# Patient Record
Sex: Female | Born: 1990 | Race: White | Hispanic: No | Marital: Single | Smoking: Current every day smoker
Health system: Southern US, Community
[De-identification: ages and names within clinical notes are randomized; demographics above are authoritative.]

## PROBLEM LIST (undated history)

## (undated) DIAGNOSIS — Z87898 Personal history of other specified conditions: Secondary | ICD-10-CM

## (undated) DIAGNOSIS — F4312 Post-traumatic stress disorder, chronic: Secondary | ICD-10-CM

## (undated) DIAGNOSIS — N898 Other specified noninflammatory disorders of vagina: Secondary | ICD-10-CM

## (undated) DIAGNOSIS — F1991 Other psychoactive substance use, unspecified, in remission: Secondary | ICD-10-CM

## (undated) DIAGNOSIS — R110 Nausea: Secondary | ICD-10-CM

## (undated) DIAGNOSIS — F319 Bipolar disorder, unspecified: Secondary | ICD-10-CM

## (undated) DIAGNOSIS — Z8619 Personal history of other infectious and parasitic diseases: Secondary | ICD-10-CM

## (undated) DIAGNOSIS — F32A Depression, unspecified: Secondary | ICD-10-CM

## (undated) DIAGNOSIS — G43909 Migraine, unspecified, not intractable, without status migrainosus: Secondary | ICD-10-CM

## (undated) DIAGNOSIS — F419 Anxiety disorder, unspecified: Secondary | ICD-10-CM

## (undated) HISTORY — DX: Depression, unspecified: F32.A

## (undated) HISTORY — DX: Personal history of other specified conditions: Z87.898

## (undated) HISTORY — DX: Personal history of other infectious and parasitic diseases: Z86.19

## (undated) HISTORY — DX: Other specified noninflammatory disorders of vagina: N89.8

## (undated) HISTORY — DX: Bipolar disorder, unspecified: F31.9

## (undated) HISTORY — DX: Nausea: R11.0

## (undated) HISTORY — DX: Anxiety disorder, unspecified: F41.9

## (undated) HISTORY — DX: Post-traumatic stress disorder, chronic: F43.12

## (undated) HISTORY — PX: BUNIONECTOMY: SHX129

## (undated) HISTORY — DX: Migraine, unspecified, not intractable, without status migrainosus: G43.909

## (undated) HISTORY — PX: APPENDECTOMY: SHX54

## (undated) HISTORY — DX: Other psychoactive substance use, unspecified, in remission: F19.91

---

## 2013-02-05 DIAGNOSIS — F319 Bipolar disorder, unspecified: Secondary | ICD-10-CM | POA: Diagnosis present

## 2016-08-01 DIAGNOSIS — B182 Chronic viral hepatitis C: Secondary | ICD-10-CM | POA: Diagnosis present

## 2017-03-20 DIAGNOSIS — F330 Major depressive disorder, recurrent, mild: Secondary | ICD-10-CM | POA: Insufficient documentation

## 2017-03-20 DIAGNOSIS — F411 Generalized anxiety disorder: Secondary | ICD-10-CM | POA: Insufficient documentation

## 2017-12-30 DIAGNOSIS — B1920 Unspecified viral hepatitis C without hepatic coma: Secondary | ICD-10-CM | POA: Insufficient documentation

## 2017-12-30 LAB — HM HIV SCREENING LAB: HM HIV Screening: NEGATIVE

## 2019-04-17 DIAGNOSIS — F411 Generalized anxiety disorder: Secondary | ICD-10-CM

## 2019-04-17 DIAGNOSIS — F330 Major depressive disorder, recurrent, mild: Secondary | ICD-10-CM

## 2019-04-20 ENCOUNTER — Other Ambulatory Visit: Payer: Self-pay

## 2019-04-20 ENCOUNTER — Ambulatory Visit (LOCAL_COMMUNITY_HEALTH_CENTER): Payer: Medicaid Other

## 2019-04-20 VITALS — BP 96/66 | Ht 62.0 in | Wt 115.0 lb

## 2019-04-20 DIAGNOSIS — Z30013 Encounter for initial prescription of injectable contraceptive: Secondary | ICD-10-CM | POA: Diagnosis not present

## 2019-04-20 DIAGNOSIS — Z3009 Encounter for other general counseling and advice on contraception: Secondary | ICD-10-CM | POA: Diagnosis not present

## 2019-04-20 MED ORDER — MEDROXYPROGESTERONE ACETATE 150 MG/ML IM SUSP
150.0000 mg | Freq: Once | INTRAMUSCULAR | Status: AC
  Administered 2019-04-20: 150 mg via INTRAMUSCULAR

## 2019-04-20 NOTE — Progress Notes (Signed)
Depo given per C. Hampton PA VO. Tolerated well Teryn Boerema, RN  

## 2019-07-06 ENCOUNTER — Ambulatory Visit (LOCAL_COMMUNITY_HEALTH_CENTER): Payer: Medicaid Other

## 2019-07-06 ENCOUNTER — Other Ambulatory Visit: Payer: Self-pay

## 2019-07-06 VITALS — BP 107/67 | Ht 61.0 in | Wt 119.5 lb

## 2019-07-06 DIAGNOSIS — Z30013 Encounter for initial prescription of injectable contraceptive: Secondary | ICD-10-CM

## 2019-07-06 DIAGNOSIS — Z3009 Encounter for other general counseling and advice on contraception: Secondary | ICD-10-CM | POA: Diagnosis not present

## 2019-07-06 MED ORDER — MEDROXYPROGESTERONE ACETATE 150 MG/ML IM SUSP
150.0000 mg | Freq: Once | INTRAMUSCULAR | Status: AC
  Administered 2019-07-06: 09:00:00 150 mg via INTRAMUSCULAR

## 2019-07-06 NOTE — Progress Notes (Signed)
Pt here for Depo. Pt reports no issues with Depo. Last Depo 04/20/2019, so pt is 11 weeks and 0 days post last Depo. Last PE 12/2017. Consulted with Antoine Primas, PA and per Antoine Primas, PA verbal order ok for pt to get Depo 150mg  IM and to return in 11-13 weeks for physical/ [place] RV. Pt received Depo 150mg  IM per Antoine Primas, PA verbal order. Pt tolerated well.Ronny Bacon, RN

## 2019-07-06 NOTE — Progress Notes (Signed)
Consulted by RN re:  Patient needs order for Depo. Verbal order given for patient to have Depo today but to schedule for RV/RP at next appointment.  Last RP 12/2017.

## 2020-02-03 ENCOUNTER — Encounter: Payer: Self-pay | Admitting: Advanced Practice Midwife

## 2020-02-03 ENCOUNTER — Other Ambulatory Visit: Payer: Self-pay

## 2020-02-03 ENCOUNTER — Ambulatory Visit (LOCAL_COMMUNITY_HEALTH_CENTER): Payer: Medicaid Other | Admitting: Advanced Practice Midwife

## 2020-02-03 VITALS — BP 106/70 | Ht 61.0 in | Wt 124.0 lb

## 2020-02-03 DIAGNOSIS — F1191 Opioid use, unspecified, in remission: Secondary | ICD-10-CM

## 2020-02-03 DIAGNOSIS — Z30013 Encounter for initial prescription of injectable contraceptive: Secondary | ICD-10-CM

## 2020-02-03 DIAGNOSIS — Z3009 Encounter for other general counseling and advice on contraception: Secondary | ICD-10-CM

## 2020-02-03 DIAGNOSIS — Z87898 Personal history of other specified conditions: Secondary | ICD-10-CM | POA: Insufficient documentation

## 2020-02-03 DIAGNOSIS — F17200 Nicotine dependence, unspecified, uncomplicated: Secondary | ICD-10-CM

## 2020-02-03 DIAGNOSIS — Z7289 Other problems related to lifestyle: Secondary | ICD-10-CM

## 2020-02-03 DIAGNOSIS — Z653 Problems related to other legal circumstances: Secondary | ICD-10-CM

## 2020-02-03 DIAGNOSIS — R630 Anorexia: Secondary | ICD-10-CM

## 2020-02-03 LAB — WET PREP FOR TRICH, YEAST, CLUE
Trichomonas Exam: NEGATIVE
Yeast Exam: NEGATIVE

## 2020-02-03 LAB — PREGNANCY, URINE: Preg Test, Ur: NEGATIVE

## 2020-02-03 MED ORDER — MULTI-VITAMIN/MINERALS PO TABS: 1.0000 | ORAL_TABLET | Freq: Every day | ORAL | 0 refills | Status: DC

## 2020-02-03 MED ORDER — MEDROXYPROGESTERONE ACETATE 150 MG/ML IM SUSP
150.0000 mg | INTRAMUSCULAR | Status: AC
  Administered 2020-02-03: 150 mg via INTRAMUSCULAR

## 2020-02-03 NOTE — Progress Notes (Signed)
Family Planning Visit- Initial Visit  Subjective:  Theresa Parker is a 29 y.o. SWF  [id number]   being seen today for an initial well woman visit and to discuss family planning options.  She is currently using nothing for pregnancy prevention. Patient reports she does not want a pregnancy in the next year.  Patient has the following medical conditions has Hepatitis C; Generalized anxiety disorder; Major depressive disorder, recurrent episode, mild (HCC); Smoker 1-2 ppd; History of crack cocaine use ages [age] with rehab x5; History of heroin use ages [age] with rehab x 5; Lost custody of daughter; Self-mutilation cutter age 55; choked self age 22; and Anorexia since childhood on their problem list.  Chief Complaint  Patient presents with  . Annual Exam    Patient reports wants DMPA but worried she hasn't had a menses since 10/24/2015.  Last DMPA 06/2019.  Last sex without condom and "sometime in may".  Not working.  Living with boyfriend and her 75 yo son who has special needs and working up for autism.  Smoker 1-2 ppd, vapes daily.  Last MJ 8 mo ago.  Crack age [age], heroin age [age] with rehab x5.  Last ETOH last night (1 wine cooler) 2x/mo.  Her daughter was adopted out and is 30 yo now living with PGM and PGF. States anorexia since a child.  Cutter age 60 and choked self age 61.  C/o low abdominal LLQ pain intermittently, no menses, no appetite,   Patient denies SI/HI   Body mass index is 23.43 kg/m. - Patient is eligible for diabetes screening based on BMI and age >37?  not applicable HA1C ordered? not applicable  Patient reports ? of partners in last year. Desires STI screening?  Yes  Has patient been screened once for HCV in the past?  Yes  No results found for: HCVAB  Does the patient have current of drug use, have a partner with drug use, and/or has been incarcerated since last result? No  If yes-- Screen for HCV through [facility]   Does the patient meet criteria for HBV testing?  No  Criteria:  -Household, sexual or needle sharing contact with HBV -History of drug use -HIV positive -Those with known Hep C   Health Maintenance Due  Topic Date Due  . COVID-19 Vaccine (1) Never done  . PAP SMEAR-Modifier  Never done    Review of Systems  All other systems reviewed and are negative.   The following portions of the patient's history were reviewed and updated as appropriate: allergies, current medications, past family history, past medical history, past social history, past surgical history and problem list. Problem list updated.   See flowsheet for other program required questions.  Objective:   Vitals:   02/03/20 0849  BP: 106/70  Weight: 124 lb (56.2 kg)  Height: 5\' 1"  (1.549 m)    Physical Exam Constitutional:      Appearance: Normal appearance. She is normal weight.  HENT:     Head: Normocephalic and atraumatic.     Mouth/Throat:     Mouth: Mucous membranes are moist.  Eyes:     Conjunctiva/sclera: Conjunctivae normal.  Cardiovascular:     Rate and Rhythm: Normal rate and regular rhythm.  Pulmonary:     Effort: Pulmonary effort is normal.     Breath sounds: Normal breath sounds.  Chest:     Breasts:        Right: Normal.        Left:  Normal.  Abdominal:     Palpations: Abdomen is soft.     Comments: Scaphoid, soft without tenderness  Genitourinary:    General: Normal vulva.     Exam position: Lithotomy position.     Vagina: Vaginal discharge (white creamy leukorrhea, ph<4.5) present.     Cervix: Normal.     Uterus: Normal.      Adnexa: Right adnexa normal and left adnexa normal.     Rectum: Normal.  Musculoskeletal:        General: Normal range of motion.     Cervical back: Normal range of motion and neck supple.  Skin:    General: Skin is warm and dry.  Neurological:     Mental Status: She is alert.  Psychiatric:        Mood and Affect: Mood normal.       Assessment and Plan:  Theresa Parker is a 29 y.o. female  presenting to the [facility] Department for an initial well woman exam/family planning visit  Contraception counseling: Reviewed all forms of birth control options in the tiered based approach. available including abstinence; over the counter/barrier methods; hormonal contraceptive medication including pill, patch, ring, injection,contraceptive implant, ECP; hormonal and nonhormonal IUDs; permanent sterilization options including vasectomy and the various tubal sterilization modalities. Risks, benefits, and typical effectiveness rates were reviewed.  Questions were answered.  Written information was also given to the patient to review.  Patient desires DMPA, this was prescribed for patient. She will follow up in  11-13 wks for surveillance.  She was told to call with any further questions, or with any concerns about this method of contraception.  Emphasized use of condoms 100% of the time for STI prevention.  Patient was offered ECP. ECP was not accepted by the patient. ECP counseling was not given - see RN documentation  1. Family planning Treat wet mount per standing orders Immunization nurse consult Please give Primary Care MD list to pt Please give pt Amanda Marvin's contact info--referral made - Pregnancy, urine - WET PREP FOR [place], YEAST, CLUE - IGP, Aptima HPV - Chlamydia/Gonorrhea  Lab  2. Smoker 1-2 ppd Counseled via 5 A's to stop smoking  3. Encounter for initial prescription of injectable contraceptive If PT neg today may have DMPA 150 mg IM q 11-13 wks x 1 year Please counsel on need for backup condoms/abstinance next 7 days  4. History of crack cocaine use ages [age] with rehab x5   5. History of heroin use ages [age] with rehab x 5  6. Lost custody of daughter   42. Self-mutilation cutter age 47; choked self age 31   8. Anorexia since childhood Referred to primary care MD and Milton Ferguson, LCSW     No follow-ups on file.  No future  appointments.  Herbie Saxon, CNM

## 2020-02-03 NOTE — Progress Notes (Signed)
UPT and wet mount negative. Bellamie Turney, RN  

## 2020-02-06 LAB — IGP, APTIMA HPV
HPV Aptima: POSITIVE — AB
PAP Smear Comment: 0

## 2020-02-09 ENCOUNTER — Encounter: Payer: Self-pay | Admitting: Advanced Practice Midwife

## 2020-02-09 DIAGNOSIS — R87619 Unspecified abnormal cytological findings in specimens from cervix uteri: Secondary | ICD-10-CM | POA: Insufficient documentation

## 2020-02-16 ENCOUNTER — Telehealth: Payer: Self-pay

## 2020-02-16 NOTE — Telephone Encounter (Signed)
Return call by patient.  Reviewed PAP results with patient. PAP Normal and HPV Positive.  Repeat PAP due in 1 year (01/2021) per Arnetha Courser, CNM.  Patient has requested to see the same provider at next physical.  She commented on how it was a "painless" PAP and she would like to see the same person.  I explained to her that when we call her next year to schedule her appointment to remind [place] to schedule with Arnetha Courser, CNM and/or same provider. Hart Carwin, RN

## 2020-02-16 NOTE — Telephone Encounter (Signed)
Left message for patient to return my call.

## 2020-02-18 DIAGNOSIS — Z419 Encounter for procedure for purposes other than remedying health state, unspecified: Secondary | ICD-10-CM | POA: Diagnosis not present

## 2020-03-20 DIAGNOSIS — Z419 Encounter for procedure for purposes other than remedying health state, unspecified: Secondary | ICD-10-CM | POA: Diagnosis not present

## 2020-04-20 DIAGNOSIS — Z419 Encounter for procedure for purposes other than remedying health state, unspecified: Secondary | ICD-10-CM | POA: Diagnosis not present

## 2020-05-18 ENCOUNTER — Telehealth: Payer: Self-pay

## 2020-05-18 NOTE — Telephone Encounter (Signed)
Phone call to pt. Pt confirms she does want to change her BC. Pt counseled that she was put in RN clinic rather than with a provider that has to change her BC orders. Pt states she needed to reschedule the 05/20/20 appt anyway due to some other conflicts. Pt unable to set another appt at this time, but would like to see Arnetha Courser, CNM when she does come in.

## 2020-05-20 DIAGNOSIS — Z419 Encounter for procedure for purposes other than remedying health state, unspecified: Secondary | ICD-10-CM | POA: Diagnosis not present

## 2020-06-20 DIAGNOSIS — Z419 Encounter for procedure for purposes other than remedying health state, unspecified: Secondary | ICD-10-CM | POA: Diagnosis not present

## 2020-07-20 DIAGNOSIS — Z419 Encounter for procedure for purposes other than remedying health state, unspecified: Secondary | ICD-10-CM | POA: Diagnosis not present

## 2020-08-20 DIAGNOSIS — Z419 Encounter for procedure for purposes other than remedying health state, unspecified: Secondary | ICD-10-CM | POA: Diagnosis not present

## 2020-09-20 DIAGNOSIS — Z419 Encounter for procedure for purposes other than remedying health state, unspecified: Secondary | ICD-10-CM | POA: Diagnosis not present

## 2020-10-18 DIAGNOSIS — Z419 Encounter for procedure for purposes other than remedying health state, unspecified: Secondary | ICD-10-CM | POA: Diagnosis not present

## 2020-11-18 DIAGNOSIS — Z419 Encounter for procedure for purposes other than remedying health state, unspecified: Secondary | ICD-10-CM | POA: Diagnosis not present

## 2020-12-18 DIAGNOSIS — Z419 Encounter for procedure for purposes other than remedying health state, unspecified: Secondary | ICD-10-CM | POA: Diagnosis not present

## 2021-01-18 DIAGNOSIS — Z419 Encounter for procedure for purposes other than remedying health state, unspecified: Secondary | ICD-10-CM | POA: Diagnosis not present

## 2021-01-26 ENCOUNTER — Telehealth: Payer: Self-pay | Admitting: Nurse Practitioner

## 2021-01-26 NOTE — Telephone Encounter (Signed)
Telephone call to patient regarding PAP follow up.  No answer, left message to call. Kayode Petion, RN  

## 2021-02-17 DIAGNOSIS — Z419 Encounter for procedure for purposes other than remedying health state, unspecified: Secondary | ICD-10-CM | POA: Diagnosis not present

## 2021-02-27 ENCOUNTER — Telehealth: Payer: Self-pay | Admitting: Nurse Practitioner

## 2021-02-27 NOTE — Telephone Encounter (Signed)
Telephone call to patients regarding PAP follow up.  Patient informed that her PAP was due.  Patient states she no longer lives in [facility] and wanted to see if she could schedule an appointment with the [facility] in her current county.  Patient to call back and schedule an appointment if needed. Glenna Fellows, RN

## 2021-03-11 DIAGNOSIS — J039 Acute tonsillitis, unspecified: Secondary | ICD-10-CM | POA: Diagnosis not present

## 2021-03-20 DIAGNOSIS — Z419 Encounter for procedure for purposes other than remedying health state, unspecified: Secondary | ICD-10-CM | POA: Diagnosis not present

## 2021-04-14 DIAGNOSIS — L292 Pruritus vulvae: Secondary | ICD-10-CM | POA: Diagnosis not present

## 2021-04-14 DIAGNOSIS — R300 Dysuria: Secondary | ICD-10-CM | POA: Diagnosis not present

## 2021-04-14 DIAGNOSIS — N390 Urinary tract infection, site not specified: Secondary | ICD-10-CM | POA: Diagnosis not present

## 2021-04-20 DIAGNOSIS — Z419 Encounter for procedure for purposes other than remedying health state, unspecified: Secondary | ICD-10-CM | POA: Diagnosis not present

## 2021-05-20 DIAGNOSIS — Z419 Encounter for procedure for purposes other than remedying health state, unspecified: Secondary | ICD-10-CM | POA: Diagnosis not present

## 2021-06-20 DIAGNOSIS — Z419 Encounter for procedure for purposes other than remedying health state, unspecified: Secondary | ICD-10-CM | POA: Diagnosis not present

## 2021-07-20 DIAGNOSIS — Z419 Encounter for procedure for purposes other than remedying health state, unspecified: Secondary | ICD-10-CM | POA: Diagnosis not present

## 2021-08-20 DIAGNOSIS — Z419 Encounter for procedure for purposes other than remedying health state, unspecified: Secondary | ICD-10-CM | POA: Diagnosis not present

## 2021-09-20 DIAGNOSIS — Z419 Encounter for procedure for purposes other than remedying health state, unspecified: Secondary | ICD-10-CM | POA: Diagnosis not present

## 2021-09-25 DIAGNOSIS — F4310 Post-traumatic stress disorder, unspecified: Secondary | ICD-10-CM | POA: Diagnosis not present

## 2021-10-10 DIAGNOSIS — F4310 Post-traumatic stress disorder, unspecified: Secondary | ICD-10-CM | POA: Diagnosis not present

## 2021-10-11 DIAGNOSIS — F4310 Post-traumatic stress disorder, unspecified: Secondary | ICD-10-CM | POA: Diagnosis not present

## 2021-10-14 DIAGNOSIS — F4310 Post-traumatic stress disorder, unspecified: Secondary | ICD-10-CM | POA: Diagnosis not present

## 2021-10-16 DIAGNOSIS — F4310 Post-traumatic stress disorder, unspecified: Secondary | ICD-10-CM | POA: Diagnosis not present

## 2021-10-17 DIAGNOSIS — F4310 Post-traumatic stress disorder, unspecified: Secondary | ICD-10-CM | POA: Diagnosis not present

## 2021-10-18 DIAGNOSIS — Z419 Encounter for procedure for purposes other than remedying health state, unspecified: Secondary | ICD-10-CM | POA: Diagnosis not present

## 2021-10-23 DIAGNOSIS — F4310 Post-traumatic stress disorder, unspecified: Secondary | ICD-10-CM | POA: Diagnosis not present

## 2021-10-24 DIAGNOSIS — F4310 Post-traumatic stress disorder, unspecified: Secondary | ICD-10-CM | POA: Diagnosis not present

## 2021-10-30 DIAGNOSIS — B1920 Unspecified viral hepatitis C without hepatic coma: Secondary | ICD-10-CM | POA: Diagnosis not present

## 2021-10-30 DIAGNOSIS — F4310 Post-traumatic stress disorder, unspecified: Secondary | ICD-10-CM | POA: Diagnosis not present

## 2021-10-30 DIAGNOSIS — E669 Obesity, unspecified: Secondary | ICD-10-CM | POA: Diagnosis not present

## 2021-10-31 DIAGNOSIS — F4310 Post-traumatic stress disorder, unspecified: Secondary | ICD-10-CM | POA: Diagnosis not present

## 2021-11-06 DIAGNOSIS — F4310 Post-traumatic stress disorder, unspecified: Secondary | ICD-10-CM | POA: Diagnosis not present

## 2021-11-07 DIAGNOSIS — F4310 Post-traumatic stress disorder, unspecified: Secondary | ICD-10-CM | POA: Diagnosis not present

## 2021-11-08 DIAGNOSIS — F4310 Post-traumatic stress disorder, unspecified: Secondary | ICD-10-CM | POA: Diagnosis not present

## 2021-11-13 DIAGNOSIS — F4310 Post-traumatic stress disorder, unspecified: Secondary | ICD-10-CM | POA: Diagnosis not present

## 2021-11-14 DIAGNOSIS — F4310 Post-traumatic stress disorder, unspecified: Secondary | ICD-10-CM | POA: Diagnosis not present

## 2021-11-16 DIAGNOSIS — F411 Generalized anxiety disorder: Secondary | ICD-10-CM | POA: Diagnosis not present

## 2021-11-18 DIAGNOSIS — Z419 Encounter for procedure for purposes other than remedying health state, unspecified: Secondary | ICD-10-CM | POA: Diagnosis not present

## 2021-11-21 DIAGNOSIS — F4310 Post-traumatic stress disorder, unspecified: Secondary | ICD-10-CM | POA: Diagnosis not present

## 2021-11-22 DIAGNOSIS — F4310 Post-traumatic stress disorder, unspecified: Secondary | ICD-10-CM | POA: Diagnosis not present

## 2021-11-28 DIAGNOSIS — F4310 Post-traumatic stress disorder, unspecified: Secondary | ICD-10-CM | POA: Diagnosis not present

## 2021-11-29 DIAGNOSIS — F4310 Post-traumatic stress disorder, unspecified: Secondary | ICD-10-CM | POA: Diagnosis not present

## 2021-12-06 DIAGNOSIS — F411 Generalized anxiety disorder: Secondary | ICD-10-CM | POA: Diagnosis not present

## 2021-12-06 DIAGNOSIS — F1011 Alcohol abuse, in remission: Secondary | ICD-10-CM | POA: Diagnosis not present

## 2021-12-06 DIAGNOSIS — F332 Major depressive disorder, recurrent severe without psychotic features: Secondary | ICD-10-CM | POA: Diagnosis not present

## 2021-12-06 DIAGNOSIS — J9801 Acute bronchospasm: Secondary | ICD-10-CM | POA: Diagnosis not present

## 2021-12-06 DIAGNOSIS — F4310 Post-traumatic stress disorder, unspecified: Secondary | ICD-10-CM | POA: Diagnosis not present

## 2021-12-07 DIAGNOSIS — F4310 Post-traumatic stress disorder, unspecified: Secondary | ICD-10-CM | POA: Diagnosis not present

## 2021-12-10 DIAGNOSIS — F4310 Post-traumatic stress disorder, unspecified: Secondary | ICD-10-CM | POA: Diagnosis not present

## 2021-12-18 DIAGNOSIS — Z419 Encounter for procedure for purposes other than remedying health state, unspecified: Secondary | ICD-10-CM | POA: Diagnosis not present

## 2021-12-21 DIAGNOSIS — F4310 Post-traumatic stress disorder, unspecified: Secondary | ICD-10-CM | POA: Diagnosis not present

## 2021-12-21 DIAGNOSIS — F411 Generalized anxiety disorder: Secondary | ICD-10-CM | POA: Diagnosis not present

## 2021-12-21 DIAGNOSIS — F332 Major depressive disorder, recurrent severe without psychotic features: Secondary | ICD-10-CM | POA: Diagnosis not present

## 2022-01-18 DIAGNOSIS — F4310 Post-traumatic stress disorder, unspecified: Secondary | ICD-10-CM | POA: Diagnosis not present

## 2022-01-18 DIAGNOSIS — Z419 Encounter for procedure for purposes other than remedying health state, unspecified: Secondary | ICD-10-CM | POA: Diagnosis not present

## 2022-01-19 ENCOUNTER — Other Ambulatory Visit: Payer: Self-pay | Admitting: Family

## 2022-02-06 ENCOUNTER — Emergency Department: Payer: Medicaid Other

## 2022-02-06 ENCOUNTER — Encounter: Payer: Self-pay | Admitting: Emergency Medicine

## 2022-02-06 ENCOUNTER — Emergency Department
Admission: EM | Admit: 2022-02-06 | Discharge: 2022-02-06 | Disposition: A | Discharge status: Home / Self Care | Payer: Medicaid Other | Attending: Emergency Medicine | Admitting: Emergency Medicine

## 2022-02-06 DIAGNOSIS — R202 Paresthesia of skin: Secondary | ICD-10-CM | POA: Diagnosis not present

## 2022-02-06 DIAGNOSIS — R200 Anesthesia of skin: Secondary | ICD-10-CM | POA: Insufficient documentation

## 2022-02-06 DIAGNOSIS — M25531 Pain in right wrist: Secondary | ICD-10-CM | POA: Diagnosis not present

## 2022-02-06 DIAGNOSIS — M25532 Pain in left wrist: Secondary | ICD-10-CM | POA: Insufficient documentation

## 2022-02-06 MED ORDER — PREDNISONE 10 MG (21) PO TBPK: ORAL_TABLET | ORAL | 0 refills | Status: DC

## 2022-02-06 NOTE — ED Triage Notes (Signed)
Pt endorses worsening wrist and hand pain, especially with writing more frequently.

## 2022-02-06 NOTE — Discharge Instructions (Signed)
Follow-up with your family doctor or orthopedics.  Please call for an appointment.  Wear the wrist brace especially at night to prevent you from bending your wrist.  This will decrease inflammation.  Can try a steroid.  After you are done with steroid return to either Advil or Aleve.

## 2022-02-06 NOTE — ED Provider Notes (Signed)
   [facility] Provider Note    None    (approximate)   History   Wrist Pain   HPI  Theresa Parker is a 31 y.o. female complains of bilateral wrist pain.  Some numbness and tingling typical carpal tunnel.  Patient states worse on the right side she is right-handed.  No numbness.  Just pain that shoots up to the thumb and into the arm      Physical Exam   Triage Vital Signs: ED Triage Vitals [02/06/22 1804]  Enc Vitals Group     BP 110/71     Pulse Rate 75     Resp 17     Temp 98.3 F (36.8 C)     Temp Source Oral     SpO2 98 %     Weight      Height      Head Circumference      Peak Flow      Pain Score      Pain Loc      Pain Edu?      Excl. in GC?     Most recent vital signs: Vitals:   02/06/22 1804  BP: 110/71  Pulse: 75  Resp: 17  Temp: 98.3 F (36.8 C)  SpO2: 98%     General: Awake, no distress.   CV:  Good peripheral perfusion. regular rate and  rhythm Resp:  Normal effort. Lungs  Abd:  No distention.  Other:  Full range of motion, right wrist tender at the carpal bones, full range of motion of fingers, neurovascular is intact   ED Results / Procedures / Treatments   Labs (all labs ordered are listed, but only abnormal results are displayed) Labs Reviewed - No data to display   EKG     RADIOLOGY X-ray of the right wrist    PROCEDURES:   Procedures   MEDICATIONS ORDERED IN ED: Medications - No data to display   IMPRESSION / MDM / ASSESSMENT AND PLAN / ED COURSE  I reviewed the triage vital signs and the nursing notes.                              Differential diagnosis includes, but is not limited to, carpal tunnel, sprain, tendinitis  Patient's presentation is most consistent with acute complicated illness / injury requiring diagnostic workup.  Did explain the findings to the patient.  She is to follow-up with orthopedics.  She is placed in a wrist brace.  Sterapred to decrease inflammation.   Discharged stable condition.      FINAL CLINICAL IMPRESSION(S) / ED DIAGNOSES   Final diagnoses:  Right wrist pain     Rx / DC Orders   ED Discharge Orders          Ordered    predniSONE (STERAPRED UNI-PAK 21 TAB) 10 MG (21) TBPK tablet        02/06/22 1806             Note:  This document was prepared using Dragon voice recognition software and may include unintentional dictation errors.    Faythe Ghee, PA-C 02/06/22 1840    Minna Antis, MD 02/06/22 [phone]

## 2022-02-15 DIAGNOSIS — F4310 Post-traumatic stress disorder, unspecified: Secondary | ICD-10-CM | POA: Diagnosis not present

## 2022-02-17 DIAGNOSIS — Z419 Encounter for procedure for purposes other than remedying health state, unspecified: Secondary | ICD-10-CM | POA: Diagnosis not present

## 2022-02-21 DIAGNOSIS — F332 Major depressive disorder, recurrent severe without psychotic features: Secondary | ICD-10-CM | POA: Diagnosis not present

## 2022-02-21 DIAGNOSIS — F411 Generalized anxiety disorder: Secondary | ICD-10-CM | POA: Diagnosis not present

## 2022-02-21 DIAGNOSIS — F1011 Alcohol abuse, in remission: Secondary | ICD-10-CM | POA: Diagnosis not present

## 2022-02-21 DIAGNOSIS — F4310 Post-traumatic stress disorder, unspecified: Secondary | ICD-10-CM | POA: Diagnosis not present

## 2022-03-04 ENCOUNTER — Other Ambulatory Visit: Payer: Self-pay

## 2022-03-04 ENCOUNTER — Emergency Department
Admission: EM | Admit: 2022-03-04 | Discharge: 2022-03-04 | Disposition: A | Discharge status: Home / Self Care | Payer: Medicaid Other | Attending: Emergency Medicine | Admitting: Emergency Medicine

## 2022-03-04 DIAGNOSIS — S20369A Insect bite (nonvenomous) of unspecified front wall of thorax, initial encounter: Secondary | ICD-10-CM | POA: Insufficient documentation

## 2022-03-04 DIAGNOSIS — S80862A Insect bite (nonvenomous), left lower leg, initial encounter: Secondary | ICD-10-CM | POA: Insufficient documentation

## 2022-03-04 DIAGNOSIS — S80861A Insect bite (nonvenomous), right lower leg, initial encounter: Secondary | ICD-10-CM | POA: Insufficient documentation

## 2022-03-04 DIAGNOSIS — W57XXXA Bitten or stung by nonvenomous insect and other nonvenomous arthropods, initial encounter: Secondary | ICD-10-CM | POA: Insufficient documentation

## 2022-03-04 LAB — POC URINE PREG, ED: Preg Test, Ur: NEGATIVE

## 2022-03-04 MED ORDER — CEPHALEXIN 500 MG PO CAPS: 500.0000 mg | ORAL_CAPSULE | Freq: Three times a day (TID) | ORAL | 0 refills | Status: DC

## 2022-03-04 NOTE — ED Triage Notes (Signed)
Pt comes with c/o rash over body. Pt states bug bites or something. Pt states back of neck, leg and chest area.

## 2022-03-04 NOTE — ED Provider Notes (Signed)
   [facility] Provider Note    Event Date/Time   First MD Initiated Contact with Patient 03/04/22 1436     (approximate)   History   Rash   HPI  Theresa Parker is a 31 y.o. female presents emergency department with complaints of infected bug bites.  Has had 3 areas that are painful and sore.  Has been scratching at them.  Used over-the-counter medications without any relief.  No fever or chills no drainage from the site      Physical Exam   Triage Vital Signs: ED Triage Vitals  Enc Vitals Group     BP 03/04/22 1435 114/69     Pulse Rate 03/04/22 1435 100     Resp 03/04/22 1435 18     Temp 03/04/22 1435 98.4 F (36.9 C)     Temp Source 03/04/22 1435 Oral     SpO2 03/04/22 1435 98 %     Weight --      Height --      Head Circumference --      Peak Flow --      Pain Score 03/04/22 1409 0     Pain Loc --      Pain Edu? --      Excl. in GC? --     Most recent vital signs: Vitals:   03/04/22 1435  BP: 114/69  Pulse: 100  Resp: 18  Temp: 98.4 F (36.9 C)  SpO2: 98%     General: Awake, no distress.   CV:  Good peripheral perfusion. regular rate and  rhythm Resp:  Normal effort.  Abd:  No distention.   Other:  Skin several infected bug bites noted on the legs and 1 on her chest.   ED Results / Procedures / Treatments   Labs (all labs ordered are listed, but only abnormal results are displayed) Labs Reviewed  POC URINE PREG, ED     EKG     RADIOLOGY     PROCEDURES:   Procedures   MEDICATIONS ORDERED IN ED: Medications - No data to display   IMPRESSION / MDM / ASSESSMENT AND PLAN / ED COURSE  I reviewed the triage vital signs and the nursing notes.                              Differential diagnosis includes, but is not limited to, infected bug bite, cellulitis, impetigo, MRSA  Patient's presentation is most consistent with acute, uncomplicated illness.   Areas appear to be bug bites that have been  scratched or infected.  Patient is afebrile here in the ED.  We will give her a prescription for Keflex 500 3 times daily for 7 days.  She is apply over-the-counter hydrocortisone cream.  Return emergency department worsening      FINAL CLINICAL IMPRESSION(S) / ED DIAGNOSES   Final diagnoses:  Bug bite, initial encounter     Rx / DC Orders   ED Discharge Orders          Ordered    cephALEXin (KEFLEX) 500 MG capsule  3 times daily        03/04/22 1437             Note:  This document was prepared using Dragon voice recognition software and may include unintentional dictation errors.    Faythe Ghee, PA-C 03/04/22 1439    Merwyn Katos, MD 03/05/22 [phone]

## 2022-03-04 NOTE — Discharge Instructions (Signed)
Follow-up with your regular doctor if not improving in 3 days.  Return if worsening.  Take medication as prescribed.  Apply over-the-counter hydrocortisone cream for pain as needed

## 2022-03-13 ENCOUNTER — Ambulatory Visit (INDEPENDENT_AMBULATORY_CARE_PROVIDER_SITE_OTHER): Payer: Medicaid Other | Admitting: Nurse Practitioner

## 2022-03-13 ENCOUNTER — Encounter: Payer: Self-pay | Admitting: Nurse Practitioner

## 2022-03-13 VITALS — BP 109/69 | HR 73 | Temp 98.4°F | Ht 63.0 in | Wt 124.3 lb

## 2022-03-13 DIAGNOSIS — Z7689 Persons encountering health services in other specified circumstances: Secondary | ICD-10-CM | POA: Diagnosis not present

## 2022-03-13 DIAGNOSIS — R8281 Pyuria: Secondary | ICD-10-CM

## 2022-03-13 DIAGNOSIS — G8929 Other chronic pain: Secondary | ICD-10-CM | POA: Diagnosis not present

## 2022-03-13 DIAGNOSIS — L729 Follicular cyst of the skin and subcutaneous tissue, unspecified: Secondary | ICD-10-CM

## 2022-03-13 DIAGNOSIS — F330 Major depressive disorder, recurrent, mild: Secondary | ICD-10-CM | POA: Diagnosis not present

## 2022-03-13 DIAGNOSIS — B977 Papillomavirus as the cause of diseases classified elsewhere: Secondary | ICD-10-CM

## 2022-03-13 DIAGNOSIS — F411 Generalized anxiety disorder: Secondary | ICD-10-CM | POA: Diagnosis not present

## 2022-03-13 DIAGNOSIS — M546 Pain in thoracic spine: Secondary | ICD-10-CM | POA: Diagnosis not present

## 2022-03-13 DIAGNOSIS — B1920 Unspecified viral hepatitis C without hepatic coma: Secondary | ICD-10-CM | POA: Diagnosis not present

## 2022-03-13 DIAGNOSIS — R8290 Unspecified abnormal findings in urine: Secondary | ICD-10-CM | POA: Diagnosis not present

## 2022-03-13 LAB — URINALYSIS, ROUTINE W REFLEX MICROSCOPIC
Bilirubin, UA: NEGATIVE
Glucose, UA: NEGATIVE
Leukocytes,UA: NEGATIVE
Nitrite, UA: NEGATIVE
RBC, UA: NEGATIVE
Specific Gravity, UA: >1.03 — ABNORMAL HIGH (ref 1.005–1.030)
Urobilinogen, Ur: 0.2 mg/dL (ref 0.2–1.0)
pH, UA: 5.5 (ref 5.0–7.5)

## 2022-03-13 LAB — MICROSCOPIC EXAMINATION
RBC, Urine: NONE SEEN /hpf (ref 0–2)
WBC, UA: NONE SEEN /hpf (ref 0–5)

## 2022-03-13 LAB — WET PREP FOR TRICH, YEAST, CLUE
Clue Cell Exam: NEGATIVE
Trichomonas Exam: NEGATIVE
Yeast Exam: NEGATIVE

## 2022-03-13 NOTE — Progress Notes (Signed)
BP 109/69   Pulse 73   Temp 98.4 F (36.9 C) (Oral)   Ht 5\' 3"  (1.6 m)   Wt 124 lb 4.8 oz (56.4 kg)   LMP  (LMP Unknown)   SpO2 99%   BMI 22.02 kg/m    Subjective:    Patient ID: , female    DOB: 1990-10-27, 31 y.o.   MRN: [id number]  HPI: Theresa Parker is a 31 y.o. female  Chief Complaint  Patient presents with   Establish Care    Patient would like multiple referrals like chiropractor, dermatologist and hand specialist per patient. Patient states it has been a long time   Looking for PTSD psychiatrist - she says she is actively seeing one but is looking for someone who specializes in PTSD/trauma.    Patient presents to clinic to establish care with new PCP.  Introduced to 34 role and practice setting.  All questions answered.  Discussed provider/patient relationship and expectations.  Patient reports a history of high cholesterol, Depression, anxiety (seeing [facility]).  Had an appendectomy and bunionectomy.    Patient denies a history of: Hypertension, Diabetes, Thyroid problems, Neurological problems, and Abdominal problems.   Patient states she has a history drug use, PTSD, and trauma.  She has not received treatment for Hep C.  She is aware of the HPV but has not seen GYN. Has ongoing back pain and would like a referral to a chiropractor.  Patient also states she has Cysts all over her body and would like to see Dermatology.  Active Ambulatory Problems    Diagnosis Date Noted   Hepatitis C 12/30/2017   Generalized anxiety disorder 03/20/2017   Major depressive disorder, recurrent episode, mild (HCC) 03/20/2017   Smoker 1-2 ppd 02/03/2020   History of crack cocaine use ages [age] with rehab x5 02/03/2020   History of heroin use ages [age] with rehab x 5 02/03/2020   Lost custody of daughter 02/03/2020   Self-mutilation cutter age 40; choked self age 4 02/03/2020   Anorexia since childhood 02/03/2020   Abnormal Pap smear of cervix  02/03/20 02/09/2020   Resolved Ambulatory Problems    Diagnosis Date Noted   No Resolved Ambulatory Problems   Past Medical History:  Diagnosis Date   History of dizziness    History of hepatitis C    History of intravenous drug use in remission    Migraine    Nausea    Vaginal discharge    Past Surgical History:  Procedure Laterality Date   APPENDECTOMY     BUNIONECTOMY     Family History  Problem Relation Age of Onset   Endometriosis Mother    Depression Father    Cervical cancer Sister    Diabetes Paternal Grandfather    Heart disease Paternal Grandfather    Hypertension Paternal Grandfather    Depression Paternal Grandfather    Leukemia Paternal Grandfather      Review of Systems  Skin:        Cyst  Psychiatric/Behavioral:  Positive for dysphoric mood. The patient is nervous/anxious.     Per HPI unless specifically indicated above     Objective:    BP 109/69   Pulse 73   Temp 98.4 F (36.9 C) (Oral)   Ht 5\' 3"  (1.6 m)   Wt 124 lb 4.8 oz (56.4 kg)   LMP  (LMP Unknown)   SpO2 99%   BMI 22.02 kg/m   Wt Readings from Last 3 Encounters:  03/13/22 124 lb 4.8 oz (56.4 kg)  02/03/20 124 lb (56.2 kg)  07/06/19 119 lb 8 oz (54.2 kg)    Physical Exam Vitals and nursing note reviewed.  Constitutional:      General: She is not in acute distress.    Appearance: Normal appearance. She is normal weight. She is not ill-appearing, toxic-appearing or diaphoretic.  HENT:     Head: Normocephalic.     Right Ear: External ear normal.     Left Ear: External ear normal.     Nose: Nose normal.     Mouth/Throat:     Mouth: Mucous membranes are moist.     Pharynx: Oropharynx is clear.  Eyes:     General:        Right eye: No discharge.        Left eye: No discharge.     Extraocular Movements: Extraocular movements intact.     Conjunctiva/sclera: Conjunctivae normal.     Pupils: Pupils are equal, round, and reactive to light.  Cardiovascular:     Rate and Rhythm:  Normal rate and regular rhythm.     Heart sounds: No murmur heard. Pulmonary:     Effort: Pulmonary effort is normal. No respiratory distress.     Breath sounds: Normal breath sounds. No wheezing or rales.  Musculoskeletal:     Cervical back: Normal range of motion and neck supple.  Skin:    General: Skin is warm and dry.     Capillary Refill: Capillary refill takes less than 2 seconds.  Neurological:     General: No focal deficit present.     Mental Status: She is alert and oriented to person, place, and time. Mental status is at baseline.  Psychiatric:        Mood and Affect: Mood normal.        Behavior: Behavior normal.        Thought Content: Thought content normal.        Judgment: Judgment normal.     Results for orders placed or performed during the hospital encounter of 03/04/22  POC urine preg, ED  Result Value Ref Range   Preg Test, Ur NEGATIVE NEGATIVE      Assessment & Plan:   Problem List Items Addressed This Visit       Digestive   Hepatitis C    Chronic. Has not received treatment.  Referral placed for patient to see GI for evaluation and treatment.       Relevant Orders   Ambulatory referral to Gastroenterology     Other   Generalized anxiety disorder    Chronic. Not well controlled.  Was on Abilify but did not like the way it made her feel.  On Effexor, Trazodone and Buspar. Continue to see psychiatry. Referral placed for trauma therapist.  Follow up in 2 months for reevaluation.       Relevant Medications   busPIRone (BUSPAR) 7.5 MG tablet   hydrOXYzine (ATARAX) 25 MG tablet   venlafaxine XR (EFFEXOR-XR) 75 MG 24 hr capsule   mirtazapine (REMERON) 7.5 MG tablet   traZODone (DESYREL) 50 MG tablet   Other Relevant Orders   Ambulatory referral to Psychology   Major depressive disorder, recurrent episode, mild (HCC) - Primary    Chronic. Not well controlled.  Was on Abilify but did not like the way it made her feel.  On Effexor, Trazodone and  Buspar. Continue to see psychiatry. Referral placed for trauma therapist.  Follow up in 2  months for reevaluation.       Relevant Medications   busPIRone (BUSPAR) 7.5 MG tablet   hydrOXYzine (ATARAX) 25 MG tablet   venlafaxine XR (EFFEXOR-XR) 75 MG 24 hr capsule   mirtazapine (REMERON) 7.5 MG tablet   traZODone (DESYREL) 50 MG tablet   Other Relevant Orders   Ambulatory referral to Psychology   Other Visit Diagnoses     HPV (human papilloma virus) infection       Discussed abnormal results from 2021. Recommend she see GYN for further evaluation and treatment.   Chronic bilateral thoracic back pain       Referral placed for patient to see Chiropractor as requested by patient.   Relevant Medications   venlafaxine XR (EFFEXOR-XR) 75 MG 24 hr capsule   mirtazapine (REMERON) 7.5 MG tablet   traZODone (DESYREL) 50 MG tablet   Other Relevant Orders   Ambulatory referral to Chiropractic   Cyst of skin       Referral placed for patient to see Dermatology.   Relevant Orders   Ambulatory referral to Dermatology   Pyuria       Denies vaginal discharge. Will check Wet Prep and UA. Will treat based on results.    Relevant Orders   Urinalysis, Routine w reflex microscopic   WET PREP FOR TRICH, YEAST, CLUE   Encounter to establish care            Follow up plan: Return in about 2 months (around 05/14/2022) for Physical and Fasting labs.

## 2022-03-13 NOTE — Progress Notes (Signed)
Please let patient know that she is negative for trichomonas and BV. It looks like she needs to drink more water and increase her protein intake.

## 2022-03-13 NOTE — Assessment & Plan Note (Signed)
Chronic. Has not received treatment.  Referral placed for patient to see GI for evaluation and treatment.

## 2022-03-13 NOTE — Assessment & Plan Note (Signed)
Chronic. Not well controlled.  Was on Abilify but did not like the way it made her feel.  On Effexor, Trazodone and Buspar. Continue to see psychiatry. Referral placed for trauma therapist.  Follow up in 2 months for reevaluation.

## 2022-03-13 NOTE — Assessment & Plan Note (Signed)
Chronic. Not well controlled.  Was on Abilify but did not like the way it made her feel.  On Effexor, Trazodone and Buspar. Continue to see psychiatry. Referral placed for trauma therapist.  Follow up in 2 months for reevaluation.  

## 2022-03-13 NOTE — Addendum Note (Signed)
Addended by: Larae Grooms on: 03/13/2022 02:17 PM   Modules accepted: Orders

## 2022-03-15 LAB — URINE CULTURE

## 2022-03-15 NOTE — Progress Notes (Signed)
Please let patient know that her urine did not grow any bacteria. She can stop the antibiotics.

## 2022-03-16 ENCOUNTER — Ambulatory Visit: Payer: Medicaid Other | Admitting: Nurse Practitioner

## 2022-03-20 DIAGNOSIS — Z419 Encounter for procedure for purposes other than remedying health state, unspecified: Secondary | ICD-10-CM | POA: Diagnosis not present

## 2022-04-05 ENCOUNTER — Encounter: Payer: Self-pay | Admitting: Psychology

## 2022-04-16 ENCOUNTER — Telehealth: Payer: Self-pay

## 2022-04-16 NOTE — Telephone Encounter (Signed)
Copied from CRM [id number]. Topic: Referral - Question >> Apr 16, 2022 10:42 AM Theresa Parker wrote: Reason for CRM: Pt said when she was in July she ask Theresa Parker if she would send a referral to [facility] but they told her they never received one.  She said with medicaid they have to have a doctors referral.  [phone]  [phone]

## 2022-04-20 DIAGNOSIS — Z419 Encounter for procedure for purposes other than remedying health state, unspecified: Secondary | ICD-10-CM | POA: Diagnosis not present

## 2022-04-25 NOTE — Telephone Encounter (Signed)
Patient has been notified

## 2022-05-01 DIAGNOSIS — F411 Generalized anxiety disorder: Secondary | ICD-10-CM | POA: Diagnosis not present

## 2022-05-01 DIAGNOSIS — F4310 Post-traumatic stress disorder, unspecified: Secondary | ICD-10-CM | POA: Insufficient documentation

## 2022-05-14 NOTE — Progress Notes (Unsigned)
There were no vitals taken for this visit.   Subjective:    Patient ID: Theresa Parker, female    DOB: 11/11/1990, 31 y.o.   MRN: [id number]  HPI: Theresa Parker is a 31 y.o. female presenting on 05/15/2022 for comprehensive medical examination. Current medical complaints include:{Blank single:19197::"none","***"}  She currently lives with: Menopausal Symptoms: {Blank single:19197::"yes","no"}  ANXIETY  Depression Screen done today and results listed below:     03/13/2022    1:12 PM 02/03/2020    9:17 AM  Depression screen PHQ 2/9  Decreased Interest 1 0  Down, Depressed, Hopeless 0 0  PHQ - 2 Score 1 0  Altered sleeping 0   Tired, decreased energy 1   Change in appetite 1   Feeling bad or failure about yourself  0   Trouble concentrating 0   Moving slowly or fidgety/restless 0   Suicidal thoughts 0   PHQ-9 Score 3   Difficult doing work/chores Not difficult at all     The patient {has/does not have:19849} a history of falls. I {did/did not:19850} complete a risk assessment for falls. A plan of care for falls {was/was not:19852} documented.   Past Medical History:  Past Medical History:  Diagnosis Date   History of dizziness    History of hepatitis C    History of intravenous drug use in remission    Migraine    Nausea    Vaginal discharge     Surgical History:  Past Surgical History:  Procedure Laterality Date   APPENDECTOMY     BUNIONECTOMY      Medications:  Current Outpatient Medications on File Prior to Visit  Medication Sig   busPIRone (BUSPAR) 7.5 MG tablet Take 7.5 mg by mouth 2 (two) times daily.   mirtazapine (REMERON) 7.5 MG tablet Take by mouth.   traZODone (DESYREL) 50 MG tablet Take 50-100 mg by mouth at bedtime as needed.   venlafaxine XR (EFFEXOR-XR) 75 MG 24 hr capsule Take 225 mg by mouth daily.   No current facility-administered medications on file prior to visit.    Allergies:  Allergies  Allergen Reactions   Latex Other (See  Comments)    Latex condoms cause yeast infections / vaginal irritation    Social History:  Social History   Socioeconomic History   Marital status: Single    Spouse name: Not on file   Number of children: 2   Years of education: 14   Highest education level: High school graduate  Occupational History   Not on file  Tobacco Use   Smoking status: Every Day    Packs/day: 1.50    Years: 14.00    Total pack years: 21.00    Types: Cigarettes, E-cigarettes   Smokeless tobacco: Never  Vaping Use   Vaping Use: Every day   Substances: Nicotine  Substance and Sexual Activity   Alcohol use: Yes    Comment: Last ETOH use 02/02/2020 pm   Drug use: Yes    Types: "Crack" cocaine, Heroin, Marijuana    Comment: Last heroin and crack cocaine use 5 years ago. Last marijuana use 8 months ago.   Sexual activity: Yes    Birth control/protection: None  Other Topics Concern   Not on file  Social History Narrative   Not on file   Social Determinants of Health   Financial Resource Strain: Not on file  Food Insecurity: Not on file  Transportation Needs: Not on file  Physical Activity: Not on file  Stress: Not  on file  Social Connections: Not on file  Intimate Partner Violence: Not At Risk (02/03/2020)   Humiliation, Afraid, Rape, and Kick questionnaire    Fear of Current or Ex-Partner: No    Emotionally Abused: No    Physically Abused: No    Sexually Abused: No   Social History   Tobacco Use  Smoking Status Every Day   Packs/day: 1.50   Years: 14.00   Total pack years: 21.00   Types: Cigarettes, E-cigarettes  Smokeless Tobacco Never   Social History   Substance and Sexual Activity  Alcohol Use Yes   Comment: Last ETOH use 02/02/2020 pm    Family History:  Family History  Problem Relation Age of Onset   Endometriosis Mother    Depression Father    Cervical cancer Sister    Diabetes Paternal Grandfather    Heart disease Paternal Grandfather    Hypertension Paternal  Grandfather    Depression Paternal Grandfather    Leukemia Paternal Grandfather     Past medical history, surgical history, medications, allergies, family history and social history reviewed with patient today and changes made to appropriate areas of the chart.   ROS All other ROS negative except what is listed above and in the HPI.      Objective:    There were no vitals taken for this visit.  Wt Readings from Last 3 Encounters:  03/13/22 124 lb 4.8 oz (56.4 kg)  02/03/20 124 lb (56.2 kg)  07/06/19 119 lb 8 oz (54.2 kg)    Physical Exam  Results for orders placed or performed in visit on 03/13/22  Microscopic Examination  Result Value Ref Range   WBC, UA None seen 0 - 5 /hpf   RBC, Urine None seen 0 - 2 /hpf   Epithelial Cells (non renal) 0-10 0 - 10 /hpf   Crystals Present (A) N/A   Crystal Type Calcium Oxalate N/A   Mucus, UA Present (A) Not Estab.   Bacteria, UA Moderate (A) None seen/Few  WET PREP FOR TRICH, YEAST, CLUE   Urine  Result Value Ref Range   Trichomonas Exam Negative Negative   Yeast Exam Negative Negative   Clue Cell Exam Negative Negative  Urine Culture   Specimen: Urine   UR  Result Value Ref Range   Urine Culture, Routine Final report    Organism ID, Bacteria Comment   Urinalysis, Routine w reflex microscopic  Result Value Ref Range   Specific Gravity, UA >1.030 (H) 1.005 - 1.030   pH, UA 5.5 5.0 - 7.5   Color, UA Amber (A) Yellow   Appearance Ur Cloudy (A) Clear   Leukocytes,UA Negative Negative   Protein,UA 1+ (A) Negative/Trace   Glucose, UA Negative Negative   Ketones, UA Trace (A) Negative   RBC, UA Negative Negative   Bilirubin, UA Negative Negative   Urobilinogen, Ur 0.2 0.2 - 1.0 mg/dL   Nitrite, UA Negative Negative   Microscopic Examination See below:       Assessment & Plan:   Problem List Items Addressed This Visit       Other   Generalized anxiety disorder - Primary   Major depressive disorder, recurrent episode,  mild (HCC)     Follow up plan: No follow-ups on file.   LABORATORY TESTING:  - Pap smear: {Blank single:19197::"pap done","not applicable","up to date","done elsewhere"}  IMMUNIZATIONS:   - Tdap: Tetanus vaccination status reviewed: {tetanus status:315746}. - Influenza: {Blank single:19197::"Up to date","Administered today","Postponed to flu season","Refused","Given elsewhere"} -  Pneumovax: {Blank single:19197::"Up to date","Administered today","Not applicable","Refused","Given elsewhere"} - Prevnar: {Blank single:19197::"Up to date","Administered today","Not applicable","Refused","Given elsewhere"} - COVID: {Blank single:19197::"Up to date","Administered today","Not applicable","Refused","Given elsewhere"} - HPV: {Blank single:19197::"Up to date","Administered today","Not applicable","Refused","Given elsewhere"} - Shingrix vaccine: {Blank single:19197::"Up to date","Administered today","Not applicable","Refused","Given elsewhere"}  SCREENING: -Mammogram: {Blank single:19197::"Up to date","Ordered today","Not applicable","Refused","Done elsewhere"}  - Colonoscopy: {Blank single:19197::"Up to date","Ordered today","Not applicable","Refused","Done elsewhere"}  - Bone Density: {Blank single:19197::"Up to date","Ordered today","Not applicable","Refused","Done elsewhere"}  -Hearing Test: {Blank single:19197::"Up to date","Ordered today","Not applicable","Refused","Done elsewhere"}  -Spirometry: {Blank single:19197::"Up to date","Ordered today","Not applicable","Refused","Done elsewhere"}   PATIENT COUNSELING:   Advised to take 1 mg of folate supplement per day if capable of pregnancy.   Sexuality: Discussed sexually transmitted diseases, partner selection, use of condoms, avoidance of unintended pregnancy  and contraceptive alternatives.   Advised to avoid cigarette smoking.  I discussed with the patient that most people either abstain from alcohol or drink within safe limits (<=14/week  and <=4 drinks/occasion for males, <=7/weeks and <= 3 drinks/occasion for females) and that the risk for alcohol disorders and other health effects rises proportionally with the number of drinks per week and how often a drinker exceeds daily limits.  Discussed cessation/primary prevention of drug use and availability of treatment for abuse.   Diet: Encouraged to adjust caloric intake to maintain  or achieve ideal body weight, to reduce intake of dietary saturated fat and total fat, to limit sodium intake by avoiding high sodium foods and not adding table salt, and to maintain adequate dietary potassium and calcium preferably from fresh fruits, vegetables, and low-fat dairy products.    stressed the importance of regular exercise  Injury prevention: Discussed safety belts, safety helmets, smoke detector, smoking near bedding or upholstery.   Dental health: Discussed importance of regular tooth brushing, flossing, and dental visits.    NEXT PREVENTATIVE PHYSICAL DUE IN 1 YEAR. No follow-ups on file.

## 2022-05-15 ENCOUNTER — Ambulatory Visit (INDEPENDENT_AMBULATORY_CARE_PROVIDER_SITE_OTHER): Payer: Medicaid Other | Admitting: Nurse Practitioner

## 2022-05-15 ENCOUNTER — Encounter: Payer: Self-pay | Admitting: Nurse Practitioner

## 2022-05-15 VITALS — BP 107/71 | HR 79 | Temp 98.8°F | Ht 63.88 in | Wt 129.6 lb

## 2022-05-15 DIAGNOSIS — Z0000 Encounter for general adult medical examination without abnormal findings: Secondary | ICD-10-CM

## 2022-05-15 DIAGNOSIS — R87810 Cervical high risk human papillomavirus (HPV) DNA test positive: Secondary | ICD-10-CM | POA: Diagnosis not present

## 2022-05-15 DIAGNOSIS — F411 Generalized anxiety disorder: Secondary | ICD-10-CM | POA: Diagnosis not present

## 2022-05-15 DIAGNOSIS — Z136 Encounter for screening for cardiovascular disorders: Secondary | ICD-10-CM

## 2022-05-15 DIAGNOSIS — F330 Major depressive disorder, recurrent, mild: Secondary | ICD-10-CM

## 2022-05-15 DIAGNOSIS — F17200 Nicotine dependence, unspecified, uncomplicated: Secondary | ICD-10-CM

## 2022-05-15 DIAGNOSIS — Z8049 Family history of malignant neoplasm of other genital organs: Secondary | ICD-10-CM | POA: Diagnosis not present

## 2022-05-15 LAB — URINALYSIS, ROUTINE W REFLEX MICROSCOPIC
Bilirubin, UA: NEGATIVE
Glucose, UA: NEGATIVE
Ketones, UA: NEGATIVE
Leukocytes,UA: NEGATIVE
Nitrite, UA: NEGATIVE
RBC, UA: NEGATIVE
Specific Gravity, UA: 1.02 (ref 1.005–1.030)
Urobilinogen, Ur: 2 mg/dL — ABNORMAL HIGH (ref 0.2–1.0)
pH, UA: 7 (ref 5.0–7.5)

## 2022-05-15 LAB — MICROSCOPIC EXAMINATION

## 2022-05-15 NOTE — Assessment & Plan Note (Signed)
Chronic. Followed by Psychiatry. Trazodone was recently increased. Continue to follow with Psychiatry.  Has an appt with Psychology in March. Follow up in 3 months.  Call [facility] if concerns arise.  

## 2022-05-15 NOTE — Assessment & Plan Note (Signed)
Chronic. Followed by Psychiatry. Trazodone was recently increased. Continue to follow with Psychiatry.  Has an appt with Psychology in March. Follow up in 3 months.  Call [facility] if concerns arise.

## 2022-05-15 NOTE — Assessment & Plan Note (Signed)
Prevnar 20 given at visit today.

## 2022-05-16 LAB — COMPREHENSIVE METABOLIC PANEL
ALT: 10 IU/L (ref 0–32)
AST: 21 IU/L (ref 0–40)
Albumin/Globulin Ratio: 1.6 (ref 1.2–2.2)
Albumin: 4.4 g/dL (ref 3.9–4.9)
Alkaline Phosphatase: 73 IU/L (ref 44–121)
BUN/Creatinine Ratio: 9 (ref 9–23)
BUN: 8 mg/dL (ref 6–20)
Bilirubin Total: 0.3 mg/dL (ref 0.0–1.2)
CO2: 21 mmol/L (ref 20–29)
Calcium: 9.2 mg/dL (ref 8.7–10.2)
Chloride: 103 mmol/L (ref 96–106)
Creatinine, Ser: 0.88 mg/dL (ref 0.57–1.00)
Globulin, Total: 2.7 g/dL (ref 1.5–4.5)
Glucose: 95 mg/dL (ref 70–99)
Potassium: 4.5 mmol/L (ref 3.5–5.2)
Sodium: 138 mmol/L (ref 134–144)
Total Protein: 7.1 g/dL (ref 6.0–8.5)
eGFR: 89 mL/min/{1.73_m2} (ref >59)

## 2022-05-16 LAB — LIPID PANEL
Chol/HDL Ratio: 4.6 ratio — ABNORMAL HIGH (ref 0.0–4.4)
Cholesterol, Total: 183 mg/dL (ref 100–199)
HDL: 40 mg/dL (ref >39)
LDL Chol Calc (NIH): 132 mg/dL — ABNORMAL HIGH (ref 0–99)
Triglycerides: 60 mg/dL (ref 0–149)
VLDL Cholesterol Cal: 11 mg/dL (ref 5–40)

## 2022-05-16 LAB — CBC WITH DIFFERENTIAL/PLATELET
Basophils Absolute: 0 10*3/uL (ref 0.0–0.2)
Basos: 0 %
EOS (ABSOLUTE): 0 10*3/uL (ref 0.0–0.4)
Eos: 0 %
Hematocrit: 38.7 % (ref 34.0–46.6)
Hemoglobin: 13 g/dL (ref 11.1–15.9)
Immature Grans (Abs): 0 10*3/uL (ref 0.0–0.1)
Immature Granulocytes: 0 %
Lymphocytes Absolute: 1.6 10*3/uL (ref 0.7–3.1)
Lymphs: 22 %
MCH: 30.8 pg (ref 26.6–33.0)
MCHC: 33.6 g/dL (ref 31.5–35.7)
MCV: 92 fL (ref 79–97)
Monocytes Absolute: 0.5 10*3/uL (ref 0.1–0.9)
Monocytes: 6 %
Neutrophils Absolute: 5.1 10*3/uL (ref 1.4–7.0)
Neutrophils: 72 %
Platelets: 185 10*3/uL (ref 150–450)
RBC: 4.22 x10E6/uL (ref 3.77–5.28)
RDW: 12.1 % (ref 11.7–15.4)
WBC: 7.2 10*3/uL (ref 3.4–10.8)

## 2022-05-16 LAB — TSH: TSH: 0.727 u[IU]/mL (ref 0.450–4.500)

## 2022-05-16 NOTE — Progress Notes (Signed)
Hi Theresa Parker. It was nice to see you yesterday.  Your lab work looks good.  Your cholesterol is slightly elevated.  I recommend a low fat diet.  No other concerns at this time. Continue with your current medication regimen.  Follow up as discussed.  Please let me know if you have any questions.

## 2022-05-20 DIAGNOSIS — Z419 Encounter for procedure for purposes other than remedying health state, unspecified: Secondary | ICD-10-CM | POA: Diagnosis not present

## 2022-05-29 ENCOUNTER — Telehealth: Payer: Self-pay

## 2022-05-29 DIAGNOSIS — F411 Generalized anxiety disorder: Secondary | ICD-10-CM | POA: Diagnosis not present

## 2022-05-29 NOTE — Telephone Encounter (Signed)
Attempted to call [facility] to make appt for patient, I was placed on hold for over 5 minutes. Will try again.

## 2022-06-01 ENCOUNTER — Encounter: Payer: Self-pay | Admitting: Obstetrics and Gynecology

## 2022-06-20 DIAGNOSIS — Z419 Encounter for procedure for purposes other than remedying health state, unspecified: Secondary | ICD-10-CM | POA: Diagnosis not present

## 2022-06-28 DIAGNOSIS — F411 Generalized anxiety disorder: Secondary | ICD-10-CM | POA: Diagnosis not present

## 2022-06-28 DIAGNOSIS — F332 Major depressive disorder, recurrent severe without psychotic features: Secondary | ICD-10-CM | POA: Diagnosis not present

## 2022-06-28 DIAGNOSIS — F1011 Alcohol abuse, in remission: Secondary | ICD-10-CM | POA: Diagnosis not present

## 2022-06-28 DIAGNOSIS — F4310 Post-traumatic stress disorder, unspecified: Secondary | ICD-10-CM | POA: Diagnosis not present

## 2022-07-19 ENCOUNTER — Ambulatory Visit (INDEPENDENT_AMBULATORY_CARE_PROVIDER_SITE_OTHER): Payer: Medicaid Other | Admitting: Obstetrics and Gynecology

## 2022-07-19 ENCOUNTER — Other Ambulatory Visit (HOSPITAL_COMMUNITY)
Admission: RE | Admit: 2022-07-19 | Discharge: 2022-07-19 | Disposition: A | Discharge status: Home / Self Care | Payer: Medicaid Other | Source: Ambulatory Visit | Attending: Obstetrics and Gynecology | Admitting: Obstetrics and Gynecology

## 2022-07-19 ENCOUNTER — Ambulatory Visit: Payer: Medicaid Other

## 2022-07-19 ENCOUNTER — Encounter: Payer: Self-pay | Admitting: Obstetrics and Gynecology

## 2022-07-19 VITALS — BP 103/74 | HR 71 | Resp 16 | Ht 61.0 in | Wt 130.6 lb

## 2022-07-19 DIAGNOSIS — Z124 Encounter for screening for malignant neoplasm of cervix: Secondary | ICD-10-CM | POA: Diagnosis not present

## 2022-07-19 DIAGNOSIS — Z3202 Encounter for pregnancy test, result negative: Secondary | ICD-10-CM | POA: Diagnosis not present

## 2022-07-19 DIAGNOSIS — R87618 Other abnormal cytological findings on specimens from cervix uteri: Secondary | ICD-10-CM | POA: Insufficient documentation

## 2022-07-19 DIAGNOSIS — N870 Mild cervical dysplasia: Secondary | ICD-10-CM

## 2022-07-19 LAB — POCT URINE PREGNANCY: Preg Test, Ur: NEGATIVE

## 2022-07-19 NOTE — Progress Notes (Signed)
      GYNECOLOGY OFFICE COLPOSCOPY PROCEDURE NOTE  31 y.o. [id number] here for colposcopy for  NILM, HR HPV+  pap smear on 02/03/2020. Discussed role for HPV in cervical dysplasia, need for surveillance. Referred from [facility] Larae Grooms, NP). LMP ended last week.   Patient gave informed written consent, time out was performed.  Placed in lithotomy position. Cervix viewed with speculum and colposcope after application of acetic acid.   Colposcopy adequate? Yes  acetowhite lesion(s) noted at 12-1 o'clock; corresponding biopsies obtained.  ECC specimen obtained.  Repeat pap smear performed.  All specimens were labeled and sent to pathology.  Chaperone was present during entire procedure.  Patient was given post procedure instructions.  Will follow up pathology and manage accordingly; patient will be contacted with results and recommendations.  Routine preventative health maintenance measures emphasized.    Hildred Laser, MD [facility] at [facility]

## 2022-07-19 NOTE — Addendum Note (Signed)
Addended by: Tommie Raymond on: 07/19/2022 03:33 PM   Modules accepted: Orders

## 2022-07-19 NOTE — Patient Instructions (Signed)
Colposcopy, Care After  The following information offers guidance on how to care for yourself after your procedure. Your health care provider may also give you more specific instructions. If you have problems or questions, contact your health care provider. What can I expect after the procedure? If you had a colposcopy without a biopsy, you can expect to feel fine right away after your procedure. However, you may have some spotting of blood for a few days. You can return to your normal activities. If you had a colposcopy with a biopsy, it is common after the procedure to have: Soreness and mild pain. These may last for a few days. Mild vaginal bleeding or discharge that is dark-colored and grainy. This may last for a few days. The discharge may be caused by a liquid (solution) that was used during the procedure. You may need to wear a sanitary pad during this time. Spotting of blood for at least 48 hours after the procedure. Follow these instructions at home: Medicines Take over-the-counter and prescription medicines only as told by your health care provider. Talk with your health care provider about what type of over-the-counter pain medicines and prescription medicines you can start to take again. It is especially important to talk with your health care provider if you take blood thinners. Activity Avoid using douche products, using tampons, and having sex for at least 3 days after the procedure or for as long as told by your health care provider. Return to your normal activities as told by your health care provider. Ask your health care provider what activities are safe for you. General instructions Ask your health care provider if you may take baths, swim, or use a hot tub. You may take showers. If you use birth control (contraception), continue to use it. Keep all follow-up visits. This is important. Contact a health care provider if: You have a fever or chills. You faint or feel  light-headed. Get help right away if: You have heavy bleeding from your vagina or pass blood clots. Heavy bleeding is bleeding that soaks through a sanitary pad in less than 1 hour. You have vaginal discharge that is abnormal, is yellow in color, or smells bad. This could be a sign of infection. You have severe pain or cramps in your lower abdomen that do not go away with medicine. Summary If you had a colposcopy without a biopsy, you can expect to feel fine right away, but you may have some spotting of blood for a few days. You can return to your normal activities. If you had a colposcopy with a biopsy, it is common to have mild pain for a few days and spotting for 48 hours after the procedure. Avoid using douche products, using tampons, and having sex for at least 3 days after the procedure or for as long as told by your health care provider. Get help right away if you have heavy bleeding, severe pain, or signs of infection. This information is not intended to replace advice given to you by your health care provider. Make sure you discuss any questions you have with your health care provider. Document Revised: 01/01/2021 Document Reviewed: 01/01/2021 Elsevier Patient Education  2023 Elsevier Inc.  

## 2022-07-20 ENCOUNTER — Encounter: Payer: Self-pay | Admitting: Obstetrics and Gynecology

## 2022-07-20 DIAGNOSIS — Z419 Encounter for procedure for purposes other than remedying health state, unspecified: Secondary | ICD-10-CM | POA: Diagnosis not present

## 2022-07-23 LAB — SURGICAL PATHOLOGY

## 2022-07-24 DIAGNOSIS — F4310 Post-traumatic stress disorder, unspecified: Secondary | ICD-10-CM | POA: Diagnosis not present

## 2022-07-24 DIAGNOSIS — F332 Major depressive disorder, recurrent severe without psychotic features: Secondary | ICD-10-CM | POA: Diagnosis not present

## 2022-07-24 DIAGNOSIS — F411 Generalized anxiety disorder: Secondary | ICD-10-CM | POA: Diagnosis not present

## 2022-07-31 LAB — CYTOLOGY - PAP
Comment: NEGATIVE
Diagnosis: UNDETERMINED — AB
High risk HPV: NEGATIVE

## 2022-08-17 ENCOUNTER — Ambulatory Visit: Payer: Medicaid Other | Admitting: Nurse Practitioner

## 2022-08-17 NOTE — Progress Notes (Deleted)
There were no vitals taken for this visit.   Subjective:    Patient ID: Theresa Parker, female    DOB: 27-Aug-1990, 31 y.o.   MRN: [id number]  HPI: Theresa Parker is a 31 y.o. female  No chief complaint on file.   ANXIETY/DEPRESSION Patient states her medication doses have been increased some but psychiatry.  She feels like they are working well for her.  She has an appt in March with a therapist.    Relevant past medical, surgical, family and social history reviewed and updated as indicated. Interim medical history since our last visit reviewed. Allergies and medications reviewed and updated.  Review of Systems  Per HPI unless specifically indicated above     Objective:    There were no vitals taken for this visit.  Wt Readings from Last 3 Encounters:  07/19/22 130 lb 9.6 oz (59.2 kg)  05/15/22 129 lb 9.6 oz (58.8 kg)  03/13/22 124 lb 4.8 oz (56.4 kg)    Physical Exam  Results for orders placed or performed in visit on 07/19/22  POCT urine pregnancy  Result Value Ref Range   Preg Test, Ur Negative Negative  Cytology - PAP  Result Value Ref Range   High risk HPV Negative    Adequacy      Satisfactory for evaluation; transformation zone component PRESENT.   Diagnosis (A)     - Atypical squamous cells of undetermined significance (ASC-US)   Comment Normal Reference Range HPV - Negative   Surgical pathology  Result Value Ref Range   SURGICAL PATHOLOGY      SURGICAL PATHOLOGY CASE: [id number] PATIENT: Theresa Parker Surgical Pathology Report     Clinical History: NILM HR HPV+ pap in 01/2020,no follow up (cm)     FINAL MICROSCOPIC DIAGNOSIS:  A. 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 ENDOCERVIX, CURETTAGE: Mucohemorrhagic debris with abundant admixed benign endocervical epithelium   GROSS DESCRIPTION:  A: The specimen is received in formalin and consists of a 0.5 cm piece of tan-white soft tissue.  The  specimen is entirely submitted in 1 cassette.  B: The specimen is received in formalin and consists of a 1.1 x 0.9 x 0.3 cm aggregate of tan-brown mucus.  The specimen is entirely submitted in 1 cassette.  Theresa Parker 07/20/2022)    Final Diagnosis performed by Jerene Bears, MD.   Electronically signed 07/23/2022 Technical and / or Professional components performed at [facility]. [facility], [address] [place], [place] [address].  Immunohistochemistry Tec hnical component (if applicable) was performed at [facility]. [address], [address], [place], [place] [address].   IMMUNOHISTOCHEMISTRY DISCLAIMER (if applicable): Some of these immunohistochemical stains may have been developed and the performance characteristics determine by [facility]. Some may not have been cleared or approved by the U.S. Food and Drug Administration. The FDA has determined that such clearance or approval is not necessary. This test is used for clinical purposes. It should not be regarded as investigational or for research. This laboratory is certified under the Clinical Laboratory Improvement Amendments of 1988 (CLIA-88) as qualified to perform high complexity clinical laboratory testing.  The controls stained appropriately.       Assessment & Plan:   Problem List Items Addressed This Visit       Other   Generalized anxiety disorder   Major depressive disorder, recurrent episode, mild (HCC) - Primary     Follow up plan: No follow-ups on  file.

## 2022-08-20 DIAGNOSIS — Z419 Encounter for procedure for purposes other than remedying health state, unspecified: Secondary | ICD-10-CM | POA: Diagnosis not present

## 2022-09-20 DIAGNOSIS — Z419 Encounter for procedure for purposes other than remedying health state, unspecified: Secondary | ICD-10-CM | POA: Diagnosis not present

## 2022-10-03 DIAGNOSIS — Z1159 Encounter for screening for other viral diseases: Secondary | ICD-10-CM | POA: Diagnosis not present

## 2022-10-19 DIAGNOSIS — Z419 Encounter for procedure for purposes other than remedying health state, unspecified: Secondary | ICD-10-CM | POA: Diagnosis not present

## 2022-10-24 ENCOUNTER — Encounter: Payer: Medicaid Other | Attending: Psychology | Admitting: Psychology

## 2022-11-01 DIAGNOSIS — Z1159 Encounter for screening for other viral diseases: Secondary | ICD-10-CM | POA: Diagnosis not present

## 2022-11-02 DIAGNOSIS — Z1159 Encounter for screening for other viral diseases: Secondary | ICD-10-CM | POA: Diagnosis not present

## 2022-11-03 DIAGNOSIS — Z1159 Encounter for screening for other viral diseases: Secondary | ICD-10-CM | POA: Diagnosis not present

## 2022-11-10 DIAGNOSIS — Z20822 Contact with and (suspected) exposure to covid-19: Secondary | ICD-10-CM | POA: Diagnosis not present

## 2022-11-10 DIAGNOSIS — Z1159 Encounter for screening for other viral diseases: Secondary | ICD-10-CM | POA: Diagnosis not present

## 2022-11-10 DIAGNOSIS — R059 Cough, unspecified: Secondary | ICD-10-CM | POA: Diagnosis not present

## 2022-11-10 DIAGNOSIS — J069 Acute upper respiratory infection, unspecified: Secondary | ICD-10-CM | POA: Diagnosis not present

## 2022-11-19 DIAGNOSIS — Z419 Encounter for procedure for purposes other than remedying health state, unspecified: Secondary | ICD-10-CM | POA: Diagnosis not present

## 2022-11-27 ENCOUNTER — Telehealth: Payer: Self-pay | Admitting: Nurse Practitioner

## 2022-11-27 NOTE — Telephone Encounter (Signed)
Outreach to patient to schedule apt to address Hepatitis and Mood for [facility] care gaps. LVM to call back. AS, CMA

## 2022-12-03 DIAGNOSIS — Z1159 Encounter for screening for other viral diseases: Secondary | ICD-10-CM | POA: Diagnosis not present

## 2022-12-19 DIAGNOSIS — Z419 Encounter for procedure for purposes other than remedying health state, unspecified: Secondary | ICD-10-CM | POA: Diagnosis not present

## 2022-12-26 ENCOUNTER — Ambulatory Visit (INDEPENDENT_AMBULATORY_CARE_PROVIDER_SITE_OTHER): Payer: Medicaid Other | Admitting: Dermatology

## 2022-12-26 VITALS — BP 95/63 | HR 83

## 2022-12-26 DIAGNOSIS — L720 Epidermal cyst: Secondary | ICD-10-CM

## 2022-12-26 DIAGNOSIS — L705 Acne excoriee des jeunes filles: Secondary | ICD-10-CM

## 2022-12-26 DIAGNOSIS — Z79899 Other long term (current) drug therapy: Secondary | ICD-10-CM

## 2022-12-26 DIAGNOSIS — L905 Scar conditions and fibrosis of skin: Secondary | ICD-10-CM

## 2022-12-26 DIAGNOSIS — Z7189 Other specified counseling: Secondary | ICD-10-CM | POA: Diagnosis not present

## 2022-12-26 DIAGNOSIS — L03221 Cellulitis of neck: Secondary | ICD-10-CM

## 2022-12-26 DIAGNOSIS — L729 Follicular cyst of the skin and subcutaneous tissue, unspecified: Secondary | ICD-10-CM

## 2022-12-26 DIAGNOSIS — L280 Lichen simplex chronicus: Secondary | ICD-10-CM | POA: Diagnosis not present

## 2022-12-26 DIAGNOSIS — L700 Acne vulgaris: Secondary | ICD-10-CM

## 2022-12-26 DIAGNOSIS — L732 Hidradenitis suppurativa: Secondary | ICD-10-CM | POA: Diagnosis not present

## 2022-12-26 DIAGNOSIS — L03818 Cellulitis of other sites: Secondary | ICD-10-CM

## 2022-12-26 MED ORDER — DOXYCYCLINE MONOHYDRATE 100 MG PO CAPS: ORAL_CAPSULE | ORAL | 1 refills | Status: DC

## 2022-12-26 MED ORDER — DOXYCYCLINE MONOHYDRATE 100 MG PO CAPS: ORAL_CAPSULE | ORAL | 0 refills | Status: DC

## 2022-12-26 NOTE — Patient Instructions (Addendum)
Doxycycline should be taken with food to prevent nausea. Do not lay down for 30 minutes after taking. Be cautious with sun exposure and use good sun protection while on this medication. Pregnant women should not take this medication.   Due to recent changes in healthcare laws, you may see results of your pathology and/or laboratory studies on MyChart before the doctors have had a chance to review them. We understand that in some cases there may be results that are confusing or concerning to you. Please understand that not all results are received at the same time and often the doctors may need to interpret multiple results in order to provide you with the best plan of care or course of treatment. Therefore, we ask that you please give us 2 business days to thoroughly review all your results before contacting the office for clarification. Should we see a critical lab result, you will be contacted sooner.   If You Need Anything After Your Visit  If you have any questions or concerns for your doctor, please call our main line at [phone] and press option 4 to reach your doctor's medical assistant. If no one answers, please leave a voicemail as directed and we will return your call as soon as possible. Messages left after 4 pm will be answered the following business day.   You may also send us a message via MyChart. We typically respond to MyChart messages within 1-2 business days.  For prescription refills, please ask your pharmacy to contact our office. Our fax number is [phone].  If you have an urgent issue when the clinic is closed that cannot wait until the next business day, you can page your doctor at the number below.    Please note that while we do our best to be available for urgent issues outside of office hours, we are not available 24/7.   If you have an urgent issue and are unable to reach us, you may choose to seek medical care at your doctor's office, retail clinic, urgent care  center, or emergency room.  If you have a medical emergency, please immediately call 911 or go to the emergency department.  Pager Numbers  - Dr. Kowalski: [phone]  - Dr. Moye: [phone]  - Dr. Stewart: [phone]  In the event of inclement weather, please call our main line at [phone] for an update on the status of any delays or closures.  Dermatology Medication Tips: Please keep the boxes that topical medications come in in order to help keep track of the instructions about where and how to use these. Pharmacies typically print the medication instructions only on the boxes and not directly on the medication tubes.   If your medication is too expensive, please contact our office at [phone] option 4 or send us a message through MyChart.   We are unable to tell what your co-pay for medications will be in advance as this is different depending on your insurance coverage. However, we may be able to find a substitute medication at lower cost or fill out paperwork to get insurance to cover a needed medication.   If a prior authorization is required to get your medication covered by your insurance company, please allow us 1-2 business days to complete this process.  Drug prices often vary depending on where the prescription is filled and some pharmacies may offer cheaper prices.  The website www.goodrx.com contains coupons for medications through different pharmacies. The prices here do not account for what the   cost may be with help from insurance (it may be cheaper with your insurance), but the website can give you the price if you did not use any insurance.  - You can print the associated coupon and take it with your prescription to the pharmacy.  - You may also stop by our office during regular business hours and pick up a GoodRx coupon card.  - If you need your prescription sent electronically to a different pharmacy, notify our office through [facility] MyChart or by  phone at [phone] option 4.     Si Usted Necesita Algo Despus de Su Visita  Tambin puede enviarnos un mensaje a travs de MyChart. Por lo general respondemos a los mensajes de MyChart en el transcurso de 1 a 2 das hbiles.  Para renovar recetas, por favor pida a su farmacia que se ponga en contacto con nuestra oficina. Nuestro nmero de fax es el [phone].  Si tiene un asunto urgente cuando la clnica est cerrada y que no puede esperar hasta el siguiente da hbil, puede llamar/localizar a su doctor(a) al nmero que aparece a continuacin.   Por favor, tenga en cuenta que aunque hacemos todo lo posible para estar disponibles para asuntos urgentes fuera del horario de oficina, no estamos disponibles las 24 horas del da, los 7 das de la semana.   Si tiene un problema urgente y no puede comunicarse con nosotros, puede optar por buscar atencin mdica  en el consultorio de su doctor(a), en una clnica privada, en un centro de atencin urgente o en una sala de emergencias.  Si tiene una emergencia mdica, por favor llame inmediatamente al 911 o vaya a la sala de emergencias.  Nmeros de bper  - Dr. Kowalski: [phone]  - Dra. Moye: [phone]  - Dra. Stewart: [phone]  En caso de inclemencias del tiempo, por favor llame a nuestra lnea principal al [phone] para una actualizacin sobre el estado de cualquier retraso o cierre.  Consejos para la medicacin en dermatologa: Por favor, guarde las cajas en las que vienen los medicamentos de uso tpico para ayudarle a seguir las instrucciones sobre dnde y cmo usarlos. Las farmacias generalmente imprimen las instrucciones del medicamento slo en las cajas y no directamente en los tubos del medicamento.   Si su medicamento es muy caro, por favor, pngase en contacto con nuestra oficina llamando al [phone] y presione la opcin 4 o envenos un mensaje a travs de MyChart.   No podemos decirle cul ser su copago  por los medicamentos por adelantado ya que esto es diferente dependiendo de la cobertura de su seguro. Sin embargo, es posible que podamos encontrar un medicamento sustituto a menor costo o llenar un formulario para que el seguro cubra el medicamento que se considera necesario.   Si se requiere una autorizacin previa para que su compaa de seguros cubra su medicamento, por favor permtanos de 1 a 2 das hbiles para completar este proceso.  Los precios de los medicamentos varan con frecuencia dependiendo del lugar de dnde se surte la receta y alguna farmacias pueden ofrecer precios ms baratos.  El sitio web www.goodrx.com tiene cupones para medicamentos de diferentes farmacias. Los precios aqu no tienen en cuenta lo que podra costar con la ayuda del seguro (puede ser ms barato con su seguro), pero el sitio web puede darle el precio si no utiliz ningn seguro.  - Puede imprimir el cupn correspondiente y llevarlo con su receta a la farmacia.  - Tambin puede pasar por   nuestra oficina durante el horario de atencin regular y recoger una tarjeta de cupones de GoodRx.  - Si necesita que su receta se enve electrnicamente a una farmacia diferente, informe a nuestra oficina a travs de MyChart de [facility] o por telfono llamando al [phone] y presione la opcin 4.  

## 2022-12-26 NOTE — Progress Notes (Signed)
New Patient Visit   Subjective  Theresa Parker is a 32 y.o. female who presents for the following: Pt c/o cystic nodules on the face, legs, and groin area for years. Patient does pick at lesions and she would like to discuss treatment options. Previous hx of drug use, but she has been drug free for 8 years, and continues to pick at lesions.  The following portions of the chart were reviewed this encounter and updated as appropriate: medications, allergies, medical history  Review of Systems:  No other skin or systemic complaints except as noted in HPI or Assessment and Plan.  Objective  Well appearing patient in no apparent distress; mood and affect are within normal limits. A focused examination was performed of the following areas: Relevant exam findings are noted in the Assessment and Plan.   Assessment & Plan          EPIDERMAL INCLUSION CYST - ruptured and inflamed with cellulitis,  but without abscess  Exam: Erythematous papule on the post neck.   Benign-appearing. Exam most consistent with an epidermal inclusion cyst. Discussed that a cyst is a benign growth that can grow over time and sometimes get irritated or inflamed. Recommend observation if it is not bothersome. Discussed option of surgical excision to remove it if it is growing, symptomatic, or other changes noted. Please call for new or changing lesions so they can be evaluated.  Start Doxycycline 100mg  po BID x 1 week. Then decrease to 1 po QD x 3 mths. Doxycycline should be taken with food to prevent nausea. Do not lay down for 30 minutes after taking. Be cautious with sun exposure and use good sun protection while on this medication. Pregnant women should not take this medication.   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 Condition is symptomatic/ bothersome to patient. Not currently at goal.  Hidradenitis Suppurativa is a chronic; persistent; non-curable, but treatable condition due to abnormal inflamed sweat glands in the body folds (axilla, inframammary, groin, medial thighs), causing recurrent painful draining cysts and scarring. It can be associated with severe scarring acne and cysts; also abscesses and scarring of scalp. The goal is control and prevention of flares, as it is not curable. Scars are permanent and can be thickened. Treatment may include daily use of topical medication and oral antibiotics.  Oral isotretinoin may also be helpful.  For some cases, Humira or Cosentyx (biologic injections) may be prescribed to decrease the inflammatory process and prevent flares.  When indicated, inflamed cysts may also be treated surgically.  Treatment Plan: See treatment for cyst on the post neck. Consider Isotretinoin at follow up if not improving with Doxycyline. Discussed potential s/e of Isotretinoin. Pamphlet given. Doxycycline should be taken with food to prevent nausea. Do not lay down for 30 minutes after taking. Be cautious with sun exposure and use good sun protection while on this medication. Pregnant women should not take this medication.   SCAR - face, trunk, extremities Exam: Dyspigmented smooth macule or patch. Benign-appearing.  Observation.  Call clinic for new or changing lesions. Recommend daily broad spectrum sunscreen SPF 30+, reapply every 2 hours as needed.    Treatment: Avoid picking. Will discuss treatment options once acne/neurodermatitis/HS is under control.  Return in about 3 months (around 03/28/2023) for acne/neurodermatitis/hs follow up.  Maylene Roes, CMA, am acting as scribe for Armida Sans, MD .  Documentation: I have reviewed the above documentation for accuracy and completeness,  and I agree with the above.  Armida Sans, MD

## 2023-01-03 ENCOUNTER — Encounter: Payer: Self-pay | Admitting: Dermatology

## 2023-01-09 DIAGNOSIS — Z1159 Encounter for screening for other viral diseases: Secondary | ICD-10-CM | POA: Diagnosis not present

## 2023-01-11 DIAGNOSIS — Z1159 Encounter for screening for other viral diseases: Secondary | ICD-10-CM | POA: Diagnosis not present

## 2023-01-14 DIAGNOSIS — Z1159 Encounter for screening for other viral diseases: Secondary | ICD-10-CM | POA: Diagnosis not present

## 2023-01-15 DIAGNOSIS — Z1159 Encounter for screening for other viral diseases: Secondary | ICD-10-CM | POA: Diagnosis not present

## 2023-01-19 DIAGNOSIS — Z419 Encounter for procedure for purposes other than remedying health state, unspecified: Secondary | ICD-10-CM | POA: Diagnosis not present

## 2023-01-31 DIAGNOSIS — F4310 Post-traumatic stress disorder, unspecified: Secondary | ICD-10-CM | POA: Diagnosis not present

## 2023-01-31 DIAGNOSIS — F411 Generalized anxiety disorder: Secondary | ICD-10-CM | POA: Diagnosis not present

## 2023-01-31 DIAGNOSIS — F332 Major depressive disorder, recurrent severe without psychotic features: Secondary | ICD-10-CM | POA: Diagnosis not present

## 2023-02-07 DIAGNOSIS — Z1159 Encounter for screening for other viral diseases: Secondary | ICD-10-CM | POA: Diagnosis not present

## 2023-02-11 DIAGNOSIS — Z1159 Encounter for screening for other viral diseases: Secondary | ICD-10-CM | POA: Diagnosis not present

## 2023-02-12 DIAGNOSIS — Z1159 Encounter for screening for other viral diseases: Secondary | ICD-10-CM | POA: Diagnosis not present

## 2023-02-18 DIAGNOSIS — Z419 Encounter for procedure for purposes other than remedying health state, unspecified: Secondary | ICD-10-CM | POA: Diagnosis not present

## 2023-03-05 DIAGNOSIS — F332 Major depressive disorder, recurrent severe without psychotic features: Secondary | ICD-10-CM | POA: Diagnosis not present

## 2023-03-21 DIAGNOSIS — Z419 Encounter for procedure for purposes other than remedying health state, unspecified: Secondary | ICD-10-CM | POA: Diagnosis not present

## 2023-03-28 ENCOUNTER — Ambulatory Visit: Payer: Medicaid Other | Admitting: Dermatology

## 2023-04-08 DIAGNOSIS — F4310 Post-traumatic stress disorder, unspecified: Secondary | ICD-10-CM | POA: Diagnosis not present

## 2023-04-08 DIAGNOSIS — F411 Generalized anxiety disorder: Secondary | ICD-10-CM | POA: Diagnosis not present

## 2023-04-08 DIAGNOSIS — F1011 Alcohol abuse, in remission: Secondary | ICD-10-CM | POA: Diagnosis not present

## 2023-04-08 DIAGNOSIS — F332 Major depressive disorder, recurrent severe without psychotic features: Secondary | ICD-10-CM | POA: Diagnosis not present

## 2023-04-21 DIAGNOSIS — Z419 Encounter for procedure for purposes other than remedying health state, unspecified: Secondary | ICD-10-CM | POA: Diagnosis not present

## 2023-05-04 ENCOUNTER — Other Ambulatory Visit: Payer: Self-pay

## 2023-05-04 DIAGNOSIS — K0889 Other specified disorders of teeth and supporting structures: Secondary | ICD-10-CM | POA: Diagnosis present

## 2023-05-04 DIAGNOSIS — K047 Periapical abscess without sinus: Secondary | ICD-10-CM | POA: Diagnosis not present

## 2023-05-04 NOTE — ED Notes (Signed)
Called pt several times tos triage no answer

## 2023-05-04 NOTE — ED Triage Notes (Signed)
Pt to ed from home via POV for dental pain that has been going on fro about one year. She states "I have an apt in January". Pt is caox4, in no acute distress and ambulatory in triage.

## 2023-05-05 ENCOUNTER — Emergency Department
Admission: EM | Admit: 2023-05-05 | Discharge: 2023-05-05 | Disposition: A | Discharge status: Home / Self Care | Payer: Medicaid Other | Attending: Emergency Medicine | Admitting: Emergency Medicine

## 2023-05-05 DIAGNOSIS — K047 Periapical abscess without sinus: Secondary | ICD-10-CM

## 2023-05-05 MED ORDER — PENICILLIN V POTASSIUM 500 MG PO TABS: 500.0000 mg | ORAL_TABLET | Freq: Four times a day (QID) | ORAL | 0 refills | Status: AC

## 2023-05-05 NOTE — ED Provider Notes (Signed)
   [facility] Provider Note    Event Date/Time   First MD Initiated Contact with Patient 05/05/23 0101     (approximate)   History   Dental Pain (left)   HPI  Theresa Parker is a 32 y.o. female with anxiety, smoking who comes ED complaining of pain at the lower central incisors for the past year, intermittently.  No trouble breathing or swallowing.  No fever or neck pain or stiffness.  Is waiting for a dentistry appointment.  Reports that intermittently she is able to express some pus from the gingiva behind the lower central incisors.     Physical Exam   Triage Vital Signs: ED Triage Vitals [05/04/23 2219]  Encounter Vitals Group     BP 108/68     Systolic BP Percentile      Diastolic BP Percentile      Pulse Rate 66     Resp 16     Temp 98 F (36.7 C)     Temp Source Oral     SpO2 98 %     Weight 125 lb (56.7 kg)     Height 5\' 1"  (1.549 m)     Head Circumference      Peak Flow      Pain Score 10     Pain Loc      Pain Education      Exclude from Growth Chart     Most recent vital signs: Vitals:   05/04/23 2219  BP: 108/68  Pulse: 66  Resp: 16  Temp: 98 F (36.7 C)  SpO2: 98%    General: Awake, no distress.  CV:  Good peripheral perfusion.  Resp:  Normal effort.  Abd:  No distention.  Other:  Neck soft, supple, no crepitus or mass, no lymphadenopathy.  Poor dentition.  Mild gingival swelling around the lower central incisors, no fluctuance or drainage   ED Results / Procedures / Treatments   Labs (all labs ordered are listed, but only abnormal results are displayed) Labs Reviewed - No data to display   RADIOLOGY    PROCEDURES:  Procedures   MEDICATIONS ORDERED IN ED: Medications - No data to display   IMPRESSION / MDM / ASSESSMENT AND PLAN / ED COURSE  I reviewed the triage vital signs and the nursing notes.                             Patient presents with odontogenic infection.  Doubt Ludwick's angina,  RPA, PTA, osteomyelitis, sialoadenitis.  Will start Pen-Vee K     FINAL CLINICAL IMPRESSION(S) / ED DIAGNOSES   Final diagnoses:  Dental infection     Rx / DC Orders   ED Discharge Orders          Ordered    penicillin v potassium (VEETID) 500 MG tablet  4 times daily        05/05/23 0116             Note:  This document was prepared using Dragon voice recognition software and may include unintentional dictation errors.   Sharman Cheek, MD 05/05/23 0130

## 2023-05-21 DIAGNOSIS — Z419 Encounter for procedure for purposes other than remedying health state, unspecified: Secondary | ICD-10-CM | POA: Diagnosis not present

## 2023-06-13 DIAGNOSIS — F4310 Post-traumatic stress disorder, unspecified: Secondary | ICD-10-CM | POA: Diagnosis not present

## 2023-06-13 DIAGNOSIS — F411 Generalized anxiety disorder: Secondary | ICD-10-CM | POA: Diagnosis not present

## 2023-06-13 DIAGNOSIS — G4700 Insomnia, unspecified: Secondary | ICD-10-CM | POA: Diagnosis not present

## 2023-06-14 NOTE — Progress Notes (Unsigned)
    NURSE VISIT NOTE  Subjective:    Patient ID: Theresa Parker, female    DOB: 1991/07/10, 32 y.o.   MRN: [id number]  HPI  Patient is a 32 y.o. G40P0210 female who presents for evaluation of amenorrhea. She believes she could be pregnant. Patient is ambivalent about pregnancy. Sexual Activity: single partner, contraception: none. Current symptoms also include: breast tenderness, fatigue, frequent urination, and positive home pregnancy test. Last period was normal.    Objective:    BP 123/81   Pulse (!) 106   Ht 5\' 1"  (1.549 m)   Wt 122 lb 4.8 oz (55.5 kg)   LMP 05/11/2023 (Exact Date)   BMI 23.11 kg/m   Lab Review  Results for orders placed or performed in visit on 06/17/23  POCT urine pregnancy  Result Value Ref Range   Preg Test, Ur Positive (A) Negative    Assessment:   1. Absence of menstruation     Plan:   Pregnancy Test: Positive  Estimated Date of Delivery: 02/15/24 BP Cuff Measurement taken. Cuff Size Adult Small Encouraged well-balanced diet, plenty of rest when needed, pre-natal vitamins daily and walking for exercise.  Discussed self-help for nausea, avoiding OTC medications until consulting provider or pharmacist, other than Tylenol as needed, minimal caffeine (1-2 cups daily) and avoiding alcohol.   She will schedule her nurse visit @ 7-[redacted] wks pregnant, u/s for dating @10  wk, and NOB visit at [redacted] wk pregnant.    Feel free to call with any questions. Patient request labs be drawn today for Beta since she has a history of miscarriage, patient states that she believes that she is further along then what is estimated today.      Fonda Kinder, CMA

## 2023-06-17 ENCOUNTER — Emergency Department: Payer: Medicaid Other

## 2023-06-17 ENCOUNTER — Emergency Department
Admission: EM | Admit: 2023-06-17 | Discharge: 2023-06-17 | Disposition: A | Discharge status: Home / Self Care | Payer: Medicaid Other | Attending: Emergency Medicine | Admitting: Emergency Medicine

## 2023-06-17 ENCOUNTER — Other Ambulatory Visit: Payer: Self-pay

## 2023-06-17 ENCOUNTER — Ambulatory Visit (INDEPENDENT_AMBULATORY_CARE_PROVIDER_SITE_OTHER): Payer: Medicaid Other

## 2023-06-17 VITALS — BP 123/81 | HR 106 | Ht 61.0 in | Wt 122.3 lb

## 2023-06-17 DIAGNOSIS — Z3201 Encounter for pregnancy test, result positive: Secondary | ICD-10-CM | POA: Diagnosis not present

## 2023-06-17 DIAGNOSIS — Z3A01 Less than 8 weeks gestation of pregnancy: Secondary | ICD-10-CM | POA: Insufficient documentation

## 2023-06-17 DIAGNOSIS — O208 Other hemorrhage in early pregnancy: Secondary | ICD-10-CM | POA: Insufficient documentation

## 2023-06-17 DIAGNOSIS — Z3490 Encounter for supervision of normal pregnancy, unspecified, unspecified trimester: Secondary | ICD-10-CM

## 2023-06-17 DIAGNOSIS — O09299 Supervision of pregnancy with other poor reproductive or obstetric history, unspecified trimester: Secondary | ICD-10-CM

## 2023-06-17 DIAGNOSIS — R102 Pelvic and perineal pain: Secondary | ICD-10-CM | POA: Diagnosis not present

## 2023-06-17 DIAGNOSIS — O26891 Other specified pregnancy related conditions, first trimester: Secondary | ICD-10-CM | POA: Diagnosis not present

## 2023-06-17 DIAGNOSIS — O0001 Abdominal pregnancy with intrauterine pregnancy: Secondary | ICD-10-CM | POA: Diagnosis not present

## 2023-06-17 DIAGNOSIS — O3481 Maternal care for other abnormalities of pelvic organs, first trimester: Secondary | ICD-10-CM | POA: Diagnosis not present

## 2023-06-17 DIAGNOSIS — N912 Amenorrhea, unspecified: Secondary | ICD-10-CM

## 2023-06-17 DIAGNOSIS — N83292 Other ovarian cyst, left side: Secondary | ICD-10-CM | POA: Diagnosis not present

## 2023-06-17 LAB — CBC
HCT: 40.3 % (ref 36.0–46.0)
Hemoglobin: 13.7 g/dL (ref 12.0–15.0)
MCH: 30.4 pg (ref 26.0–34.0)
MCHC: 34 g/dL (ref 30.0–36.0)
MCV: 89.4 fL (ref 80.0–100.0)
Platelets: 230 10*3/uL (ref 150–400)
RBC: 4.51 MIL/uL (ref 3.87–5.11)
RDW: 11.6 % (ref 11.5–15.5)
WBC: 11 10*3/uL — ABNORMAL HIGH (ref 4.0–10.5)
nRBC: 0 % (ref 0.0–0.2)

## 2023-06-17 LAB — BASIC METABOLIC PANEL
Anion gap: 10 (ref 5–15)
BUN: 10 mg/dL (ref 6–20)
CO2: 22 mmol/L (ref 22–32)
Calcium: 9.4 mg/dL (ref 8.9–10.3)
Chloride: 101 mmol/L (ref 98–111)
Creatinine, Ser: 0.76 mg/dL (ref 0.44–1.00)
GFR, Estimated: >60 mL/min (ref >60)
Glucose, Bld: 129 mg/dL — ABNORMAL HIGH (ref 70–99)
Potassium: 3.8 mmol/L (ref 3.5–5.1)
Sodium: 133 mmol/L — ABNORMAL LOW (ref 135–145)

## 2023-06-17 LAB — POCT URINE PREGNANCY: Preg Test, Ur: POSITIVE — AB

## 2023-06-17 LAB — HCG, QUANTITATIVE, PREGNANCY: hCG, Beta Chain, Quant, S: 11461 m[IU]/mL — ABNORMAL HIGH (ref <5)

## 2023-06-17 NOTE — ED Notes (Signed)
See triage note  Presents with some abd cramping States she felt like she was pregnant  Took test which was positive   States she just feels like something is wrong Has had some abnormal bleeding with some clots in the past

## 2023-06-17 NOTE — ED Triage Notes (Signed)
Pt to ED for vaginal bleeding 3 weeks ago. Reports has taken multiple positive preg test. Concerned about miscarriage. Pt went to OB today and states not satisfied.

## 2023-06-17 NOTE — ED Provider Notes (Signed)
[facility] Provider Note    Event Date/Time   First MD Initiated Contact with Patient 06/17/23 1157     (approximate)   History   Vaginal Bleeding   HPI  Theresa Parker is a 32 y.o. female with PMH of hep C, anxiety, depression, substance abuse presents for confirmation of pregnancy test.  Patient states that she had unprotected sex in September and then at the beginning of October had a heavier period than normal and was passing large clots.  Since then she has not had any bleeding.  She took a pregnancy test at home which was positive.  She thinks that maybe at the beginning of the month she had a miscarriage and does not understand why her pregnancy test would still be positive.  She was seen by her OB this morning and told she was [redacted] weeks pregnant.  Patient reports earlier this month she was having frequent night sweats, but denies fevers.  She stopped taking some of her psych medications when she found out she was pregnant and also has an ongoing dental infection with a draining abscess in her mouth.     Physical Exam   Triage Vital Signs: ED Triage Vitals  Encounter Vitals Group     BP 06/17/23 1151 (!) 139/96     Systolic BP Percentile --      Diastolic BP Percentile --      Pulse Rate 06/17/23 1151 95     Resp 06/17/23 1149 18     Temp 06/17/23 1149 98.9 F (37.2 C)     Temp src --      SpO2 06/17/23 1151 97 %     Weight --      Height 06/17/23 1151 5\' 1"  (1.549 m)     Head Circumference --      Peak Flow --      Pain Score 06/17/23 1151 4     Pain Loc --      Pain Education --      Exclude from Growth Chart --     Most recent vital signs: Vitals:   06/17/23 1149 06/17/23 1151  BP:  (!) 139/96  Pulse:  95  Resp: 18   Temp: 98.9 F (37.2 C)   SpO2:  97%    General: Awake, no distress.  CV:  Good peripheral perfusion.  RRR. Resp:  Normal effort.  CTAB. Abd:  No distention.  Tender to palpation suprapubically and in the  bilateral lower abdomen. Other:     ED Results / Procedures / Treatments   Labs (all labs ordered are listed, but only abnormal results are displayed) Labs Reviewed  CBC - Abnormal; Notable for the following components:      Result Value   WBC 11.0 (*)    All other components within normal limits  BASIC METABOLIC PANEL - Abnormal; Notable for the following components:   Sodium 133 (*)    Glucose, Bld 129 (*)    All other components within normal limits  HCG, QUANTITATIVE, PREGNANCY - Abnormal; Notable for the following components:   hCG, Beta Chain, Quant, S 11,461 (*)    All other components within normal limits    RADIOLOGY  Pelvic ultrasound obtained, interpreted the images as well as reviewed the radiologist report.   PROCEDURES:  Critical Care performed: No  Procedures   MEDICATIONS ORDERED IN ED: Medications - No data to display   IMPRESSION / MDM / ASSESSMENT AND PLAN / ED COURSE  I reviewed the triage vital signs and the nursing notes.                             33 year old female presents for confirmation of a positive pregnancy test.  Was hypertensive in triage otherwise VSS and patient NAD on exam.  Differential diagnosis includes, but is not limited to, IUP, threatened miscarriage, completed miscarriage, retained products of conception, pelvic inflammatory disease.  Patient's presentation is most consistent with acute complicated illness / injury requiring diagnostic workup.  BMP and CBC unremarkable.  Beta hCG is 11,461 today which is appropriate given patient's estimated gestational age of [redacted] weeks.  Pelvic ultrasound obtained to evaluate for IUP.  I interpreted the images but patient did not want to wait for official radiology read.  Based on my interpretation there is a gestational and yolk sac within the uterus so this is an IUP.  Crown-rump length estimates the gestational age as 5 weeks and 6 days.  There were no fetal heart tones identified but it  is a bit early for this.  I explained to the patient that she will need to follow up with her OB for repeat beta-HCG to assess whether this is a viable pregnancy or not. She was given return precautions. She voiced understanding, was agreeable to plan and was stable at discharge.       FINAL CLINICAL IMPRESSION(S) / ED DIAGNOSES   Final diagnoses:  Intrauterine pregnancy     Rx / DC Orders   ED Discharge Orders     None        Note:  This document was prepared using Dragon voice recognition software and may include unintentional dictation errors.   Cameron Ali, PA-C 06/17/23 1600    Chesley Noon, MD 06/19/23 [phone]

## 2023-06-17 NOTE — ED Notes (Signed)
Patient transported to US 

## 2023-06-17 NOTE — Addendum Note (Signed)
Addended by: Fonda Kinder on: 06/17/2023 11:35 AM   Modules accepted: Orders

## 2023-06-17 NOTE — Discharge Instructions (Addendum)
Please follow up with your OBGYN for a repeat Beta-HCG draw in two days. Your beta-HCG was 11,461 today. Your beta HCG should double every 48 hours at this stage of your pregnancy. Your ultrasound showed that your gestation age is 5 weeks and 6 days.   Return to the ED with any new or worsening symptoms.

## 2023-06-17 NOTE — Patient Instructions (Signed)

## 2023-06-18 LAB — HUMAN CHORIONIC GONADOTROPIN(HCG),B-SUBUNIT,QUANTITATIVE): HCG, Beta Chain, Quant, S: 7978 m[IU]/mL

## 2023-06-21 DIAGNOSIS — Z419 Encounter for procedure for purposes other than remedying health state, unspecified: Secondary | ICD-10-CM | POA: Diagnosis not present

## 2023-06-25 ENCOUNTER — Ambulatory Visit: Payer: Medicaid Other

## 2023-06-26 ENCOUNTER — Ambulatory Visit
Admission: RE | Admit: 2023-06-26 | Discharge: 2023-06-26 | Disposition: A | Discharge status: Home / Self Care | Payer: Medicaid Other | Source: Ambulatory Visit | Attending: Certified Nurse Midwife | Admitting: Certified Nurse Midwife

## 2023-06-26 DIAGNOSIS — O09299 Supervision of pregnancy with other poor reproductive or obstetric history, unspecified trimester: Secondary | ICD-10-CM | POA: Diagnosis not present

## 2023-07-01 ENCOUNTER — Telehealth: Payer: Medicaid Other

## 2023-07-21 DIAGNOSIS — Z419 Encounter for procedure for purposes other than remedying health state, unspecified: Secondary | ICD-10-CM | POA: Diagnosis not present

## 2023-07-30 DIAGNOSIS — O0993 Supervision of high risk pregnancy, unspecified, third trimester: Secondary | ICD-10-CM | POA: Insufficient documentation

## 2023-08-21 DIAGNOSIS — Z419 Encounter for procedure for purposes other than remedying health state, unspecified: Secondary | ICD-10-CM | POA: Diagnosis not present

## 2023-09-19 DIAGNOSIS — O208 Other hemorrhage in early pregnancy: Secondary | ICD-10-CM | POA: Diagnosis not present

## 2023-09-21 DIAGNOSIS — Z419 Encounter for procedure for purposes other than remedying health state, unspecified: Secondary | ICD-10-CM | POA: Diagnosis not present

## 2023-09-23 DIAGNOSIS — O0992 Supervision of high risk pregnancy, unspecified, second trimester: Secondary | ICD-10-CM | POA: Diagnosis not present

## 2023-09-23 DIAGNOSIS — Z131 Encounter for screening for diabetes mellitus: Secondary | ICD-10-CM | POA: Diagnosis not present

## 2023-09-23 DIAGNOSIS — Z113 Encounter for screening for infections with a predominantly sexual mode of transmission: Secondary | ICD-10-CM | POA: Diagnosis not present

## 2023-09-23 DIAGNOSIS — Z124 Encounter for screening for malignant neoplasm of cervix: Secondary | ICD-10-CM | POA: Diagnosis not present

## 2023-09-23 LAB — OB RESULTS CONSOLE RUBELLA ANTIBODY, IGM: Rubella: IMMUNE

## 2023-09-23 LAB — OB RESULTS CONSOLE VARICELLA ZOSTER ANTIBODY, IGG: Varicella: IMMUNE

## 2023-10-15 ENCOUNTER — Encounter: Payer: Self-pay | Admitting: Certified Nurse Midwife

## 2023-10-15 DIAGNOSIS — O98419 Viral hepatitis complicating pregnancy, unspecified trimester: Secondary | ICD-10-CM

## 2023-10-16 ENCOUNTER — Other Ambulatory Visit: Payer: Self-pay | Admitting: Certified Nurse Midwife

## 2023-10-16 DIAGNOSIS — O98419 Viral hepatitis complicating pregnancy, unspecified trimester: Secondary | ICD-10-CM

## 2023-10-19 DIAGNOSIS — Z419 Encounter for procedure for purposes other than remedying health state, unspecified: Secondary | ICD-10-CM | POA: Diagnosis not present

## 2023-10-22 DIAGNOSIS — O0992 Supervision of high risk pregnancy, unspecified, second trimester: Secondary | ICD-10-CM | POA: Diagnosis not present

## 2023-10-30 DIAGNOSIS — B1920 Unspecified viral hepatitis C without hepatic coma: Secondary | ICD-10-CM | POA: Diagnosis not present

## 2023-10-30 DIAGNOSIS — Z7189 Other specified counseling: Secondary | ICD-10-CM | POA: Diagnosis not present

## 2023-10-30 DIAGNOSIS — Z3A27 27 weeks gestation of pregnancy: Secondary | ICD-10-CM | POA: Diagnosis not present

## 2023-10-30 DIAGNOSIS — O98419 Viral hepatitis complicating pregnancy, unspecified trimester: Secondary | ICD-10-CM | POA: Diagnosis not present

## 2023-10-30 DIAGNOSIS — B182 Chronic viral hepatitis C: Secondary | ICD-10-CM | POA: Diagnosis not present

## 2023-10-30 DIAGNOSIS — F1911 Other psychoactive substance abuse, in remission: Secondary | ICD-10-CM | POA: Diagnosis not present

## 2023-11-10 DIAGNOSIS — H5213 Myopia, bilateral: Secondary | ICD-10-CM | POA: Diagnosis not present

## 2023-11-18 ENCOUNTER — Ambulatory Visit: Attending: Certified Nurse Midwife

## 2023-11-18 DIAGNOSIS — Z3A27 27 weeks gestation of pregnancy: Secondary | ICD-10-CM

## 2023-11-18 NOTE — Progress Notes (Signed)
 New OB Intake  I connected with Theresa Parker  on 11/18/23 at 03:35 PM by phone and verified that I am speaking with the correct person using two identifiers. [facility] is located at [facility] and pt is located at home.   I explained I am completing New Patient Intake today. We discussed EDD of 02/15/2024, by Last Menstrual Period. Pt is G4P0210. I reviewed her allergies, medications and Medical/Surgical/OB history.    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   All questions were answered.

## 2023-11-20 ENCOUNTER — Ambulatory Visit: Payer: Medicaid Other | Attending: Maternal & Fetal Medicine

## 2023-11-20 ENCOUNTER — Other Ambulatory Visit: Payer: Self-pay

## 2023-11-20 DIAGNOSIS — B1920 Unspecified viral hepatitis C without hepatic coma: Secondary | ICD-10-CM | POA: Diagnosis not present

## 2023-11-20 DIAGNOSIS — B182 Chronic viral hepatitis C: Secondary | ICD-10-CM

## 2023-11-20 DIAGNOSIS — O285 Abnormal chromosomal and genetic finding on antenatal screening of mother: Secondary | ICD-10-CM

## 2023-11-20 DIAGNOSIS — O98412 Viral hepatitis complicating pregnancy, second trimester: Secondary | ICD-10-CM | POA: Insufficient documentation

## 2023-11-20 DIAGNOSIS — Z3A27 27 weeks gestation of pregnancy: Secondary | ICD-10-CM | POA: Diagnosis not present

## 2023-11-20 DIAGNOSIS — Z141 Cystic fibrosis carrier: Secondary | ICD-10-CM | POA: Diagnosis not present

## 2023-11-20 DIAGNOSIS — O98419 Viral hepatitis complicating pregnancy, unspecified trimester: Secondary | ICD-10-CM

## 2023-11-20 DIAGNOSIS — Z362 Encounter for other antenatal screening follow-up: Secondary | ICD-10-CM

## 2023-11-30 DIAGNOSIS — Z419 Encounter for procedure for purposes other than remedying health state, unspecified: Secondary | ICD-10-CM | POA: Diagnosis not present

## 2023-12-03 DIAGNOSIS — O0992 Supervision of high risk pregnancy, unspecified, second trimester: Secondary | ICD-10-CM | POA: Diagnosis not present

## 2023-12-03 LAB — OB RESULTS CONSOLE RPR: RPR: REACTIVE

## 2023-12-03 LAB — OB RESULTS CONSOLE HIV ANTIBODY (ROUTINE TESTING): HIV: NONREACTIVE

## 2023-12-07 ENCOUNTER — Other Ambulatory Visit: Payer: Self-pay

## 2023-12-07 DIAGNOSIS — R0789 Other chest pain: Secondary | ICD-10-CM | POA: Diagnosis not present

## 2023-12-07 DIAGNOSIS — R079 Chest pain, unspecified: Principal | ICD-10-CM | POA: Insufficient documentation

## 2023-12-07 DIAGNOSIS — R791 Abnormal coagulation profile: Secondary | ICD-10-CM | POA: Insufficient documentation

## 2023-12-07 DIAGNOSIS — R7989 Other specified abnormal findings of blood chemistry: Secondary | ICD-10-CM | POA: Diagnosis not present

## 2023-12-07 LAB — CBC
HCT: 33.3 % — ABNORMAL LOW (ref 36.0–46.0)
Hemoglobin: 11.6 g/dL — ABNORMAL LOW (ref 12.0–15.0)
MCH: 32.2 pg (ref 26.0–34.0)
MCHC: 34.8 g/dL (ref 30.0–36.0)
MCV: 92.5 fL (ref 80.0–100.0)
Platelets: 171 10*3/uL (ref 150–400)
RBC: 3.6 MIL/uL — ABNORMAL LOW (ref 3.87–5.11)
RDW: 12.9 % (ref 11.5–15.5)
WBC: 16 10*3/uL — ABNORMAL HIGH (ref 4.0–10.5)
nRBC: 0 % (ref 0.0–0.2)

## 2023-12-07 NOTE — ED Triage Notes (Signed)
 Pt from home with friend via POV.  Pt reports L upper rib pain which is worse when lying on side, coughing, sneezing.  Pt reports increased frequency of coughing and sneezing r/t allergies.

## 2023-12-07 NOTE — ED Notes (Signed)
 This RN called labor and delivery, explained pt symptoms and they state that pt can be seen down in ER

## 2023-12-08 ENCOUNTER — Emergency Department

## 2023-12-08 ENCOUNTER — Observation Stay
Admission: EM | Admit: 2023-12-08 | Discharge: 2023-12-08 | Disposition: A | Discharge status: Against Medical Advice | Attending: Obstetrics and Gynecology | Admitting: Obstetrics and Gynecology

## 2023-12-08 DIAGNOSIS — R071 Chest pain on breathing: Secondary | ICD-10-CM | POA: Diagnosis present

## 2023-12-08 DIAGNOSIS — R7989 Other specified abnormal findings of blood chemistry: Secondary | ICD-10-CM

## 2023-12-08 DIAGNOSIS — R079 Chest pain, unspecified: Secondary | ICD-10-CM | POA: Diagnosis not present

## 2023-12-08 LAB — BASIC METABOLIC PANEL WITH GFR
Anion gap: 12 (ref 5–15)
BUN: 11 mg/dL (ref 6–20)
CO2: 21 mmol/L — ABNORMAL LOW (ref 22–32)
Calcium: 8.6 mg/dL — ABNORMAL LOW (ref 8.9–10.3)
Chloride: 101 mmol/L (ref 98–111)
Creatinine, Ser: 0.64 mg/dL (ref 0.44–1.00)
GFR, Estimated: >60 mL/min (ref >60)
Glucose, Bld: 86 mg/dL (ref 70–99)
Potassium: 3.8 mmol/L (ref 3.5–5.1)
Sodium: 134 mmol/L — ABNORMAL LOW (ref 135–145)

## 2023-12-08 LAB — TROPONIN I (HIGH SENSITIVITY)
Troponin I (High Sensitivity): 4 ng/L (ref <18)
Troponin I (High Sensitivity): 4 ng/L (ref <18)

## 2023-12-08 LAB — D-DIMER, QUANTITATIVE: D-Dimer, Quant: 1.26 ug{FEU}/mL — ABNORMAL HIGH (ref 0.00–0.50)

## 2023-12-08 MED ORDER — ACETAMINOPHEN 325 MG PO TABS: 650.0000 mg | ORAL_TABLET | ORAL | Status: DC | PRN

## 2023-12-08 MED ORDER — PRENATAL MULTIVITAMIN CH: 1.0000 | ORAL_TABLET | Freq: Every day | ORAL | Status: DC

## 2023-12-08 NOTE — ED Provider Notes (Addendum)
 [facility] Provider Note    Event Date/Time   First MD Initiated Contact with Patient 12/08/23 0036     (approximate)   History   Flank Pain   HPI  Theresa Parker is a 33 y.o. female   Past medical history of [redacted] weeks pregnant high risk due to hepatitis C, previous substance use, here with left-sided chest pain.  This been ongoing for 2 weeks, constant but worse with deep breaths.  She denies any leg pain or swelling.  No history of blood clot.  She does use a vape. She has seasonal allergies which she attributes her runny nose and cough to.  She denies fever or chills.  Denies vaginal discharge bleeding or gush of fluids.  Independent Historian contributed to assessment above: Her friend is at bedside to corroborate information past medical history as above  External Medical Documents Reviewed: Ultrasound from April shows single live intrauterine pregnancy      Physical Exam   Triage Vital Signs: ED Triage Vitals  Encounter Vitals Group     BP 12/07/23 2319 110/78     Systolic BP Percentile --      Diastolic BP Percentile --      Pulse Rate 12/07/23 2319 83     Resp 12/07/23 2319 18     Temp 12/07/23 2319 98.8 F (37.1 C)     Temp Source 12/07/23 2319 Oral     SpO2 12/07/23 2319 97 %     Weight 12/07/23 2315 145 lb (65.8 kg)     Height 12/07/23 2315 5\' 1"  (1.549 m)     Head Circumference --      Peak Flow --      Pain Score 12/07/23 2315 5     Pain Loc --      Pain Education --      Exclude from Growth Chart --     Most recent vital signs: Vitals:   12/07/23 2319  BP: 110/78  Pulse: 83  Resp: 18  Temp: 98.8 F (37.1 C)  SpO2: 97%    General: Awake, no distress.  CV:  Good peripheral perfusion.  Resp:  Normal effort.  Abd:  No distention. Other:  There is no skin rash to the affected left chest wall area, no tenderness to palpation, and clear lungs without focality or wheezing.  Her abdomen is slightly distended  consistent with her gravid 30-week pregnancy, but is soft and nontender.   ED Results / Procedures / Treatments   Labs (all labs ordered are listed, but only abnormal results are displayed) Labs Reviewed  BASIC METABOLIC PANEL WITH GFR - Abnormal; Notable for the following components:      Result Value   Sodium 134 (*)    CO2 21 (*)    Calcium 8.6 (*)    All other components within normal limits  CBC - Abnormal; Notable for the following components:   WBC 16.0 (*)    RBC 3.60 (*)    Hemoglobin 11.6 (*)    HCT 33.3 (*)    All other components within normal limits  D-DIMER, QUANTITATIVE - Abnormal; Notable for the following components:   D-Dimer, Quant 1.26 (*)    All other components within normal limits  TROPONIN I (HIGH SENSITIVITY)  TROPONIN I (HIGH SENSITIVITY)     I ordered and reviewed the above labs they are notable for leukocytosis 16  EKG  ED ECG REPORT I, Buell Carmin, the attending physician, personally viewed and  interpreted this ECG.   Date: 12/08/2023  EKG Time: 2324  Rate: 82  Rhythm: sinus  Axis: nl  Intervals:none  ST&T Change:  no stemi    RADIOLOGY I independently reviewed and interpreted chest x-ray and see no focality or pneumothorax I also reviewed radiologist's formal read.   PROCEDURES:  Critical Care performed: No  Procedures   MEDICATIONS ORDERED IN ED: Medications - No data to display  External physician / consultants:  I spoke with hospital medicine for admission and regarding care plan for this patient.   IMPRESSION / MDM / ASSESSMENT AND PLAN / ED COURSE  I reviewed the triage vital signs and the nursing notes.                                Patient's presentation is most consistent with acute presentation with potential threat to life or bodily function.  Differential diagnosis includes, but is not limited to, PE, respiratory infection, musculoskeletal pain, costochondritis, acs   The patient is on the cardiac  monitor to evaluate for evidence of arrhythmia and/or significant heart rate changes.  MDM:    2 weeks of nonspecific left-sided chest pain is worse with deep breaths, with questionable respiratory infectious symptoms of a cough that she attributes to her allergies.  She looks comfortable and stable with normal hemodynamics no hypoxemia breathing comfortably.  She has leukocytosis but no focality on chest x-ray, not consistent with a bacterial pneumonia.  No skin rash to suggest shingles.  No trauma or focal tenderness to palpation to suggest injury, rib fracture, contusion.  I wonder about PE given her pregnant status and pleuritic chest pain.  Certainly not massive given her normal hemodynamics.  Check D-dimer to start.  Not consistent with ACS, check EKG which fortunately looks nonischemic and can follow-up with troponin.  --- Dimer elevated ordered for VQ scan but will be able to get it tonight, will need coordination to see when the scan can be done will admit in the meantime to await VQ scan.  Hemodynamically stable, fortunately.  Patient informed of plan.  Speaking with [facility] gynecology to see if better served on the OB/GYN floor versus hospital medicine floor.   Unfortunately the patient decided to leave AGAINST MEDICAL ADVICE given that she had personal matters to attend to.  I explained the risks of leaving at this time including worsening condition disability or death and she expresses understanding has decision-making capacity.  She will call her regular doctor/obstetrics/gynecology In the morning to try to get expedited outpatient follow-up and VQ scan, or come back to the emergency department after attending to her needs at home.     FINAL CLINICAL IMPRESSION(S) / ED DIAGNOSES   Final diagnoses:  Nonspecific chest pain  Positive D dimer     Rx / DC Orders   ED Discharge Orders     None        Note:  This document was prepared using Dragon voice recognition  software and may include unintentional dictation errors.    Buell Carmin, MD 12/08/23 1610    Buell Carmin, MD 12/08/23 [phone]

## 2023-12-08 NOTE — Progress Notes (Signed)
 After placing admission orders, was informed that patient is leaving AMA. D/c'd my admission orders.  Jenifer E Dyneshia Baccam 12/08/2023 3:26 AM

## 2023-12-08 NOTE — Discharge Instructions (Signed)
 You had testing to assess for your left-sided chest pain/flank pain today which showed an elevated D-dimer test for which you were recommended to stay for a VQ scan (ventilation/perfusion scan) to check for blood clot in your lung.  Unfortunately you had to leave the emergency department prior to getting this test done.  Please call your regular doctor or your obstetrics/gynecology doctor to see if you can get this testing arranged as an outpatient as soon as possible.  You can also come back to the emergency department at any time to continue with testing.  You should definitely call 911 or come back to the emergency department immediately should you symptoms worsen.  Thank you for choosing us  for your health care today!  Please see your primary doctor this week for a follow up appointment.   If you have any new, worsening, or unexpected symptoms call your doctor right away or come back to the emergency department for reevaluation.  It was my pleasure to care for you today.   Arron Large Margery Sheets, MD

## 2023-12-08 NOTE — ED Notes (Signed)
 Pt left the building before this nurse was able to give her her discharge paperwork. MD stated he "handed her the discharge paperwork and watched her leave"

## 2023-12-14 ENCOUNTER — Emergency Department

## 2023-12-14 ENCOUNTER — Emergency Department
Admission: EM | Admit: 2023-12-14 | Discharge: 2023-12-14 | Discharge status: Against Medical Advice | Attending: Emergency Medicine | Admitting: Emergency Medicine

## 2023-12-14 DIAGNOSIS — R0789 Other chest pain: Secondary | ICD-10-CM | POA: Insufficient documentation

## 2023-12-14 DIAGNOSIS — Z5329 Procedure and treatment not carried out because of patient's decision for other reasons: Secondary | ICD-10-CM | POA: Insufficient documentation

## 2023-12-14 DIAGNOSIS — R059 Cough, unspecified: Secondary | ICD-10-CM | POA: Insufficient documentation

## 2023-12-14 DIAGNOSIS — O26893 Other specified pregnancy related conditions, third trimester: Secondary | ICD-10-CM | POA: Diagnosis not present

## 2023-12-14 DIAGNOSIS — F17200 Nicotine dependence, unspecified, uncomplicated: Secondary | ICD-10-CM | POA: Diagnosis not present

## 2023-12-14 DIAGNOSIS — Z3A31 31 weeks gestation of pregnancy: Secondary | ICD-10-CM | POA: Diagnosis not present

## 2023-12-14 DIAGNOSIS — R079 Chest pain, unspecified: Secondary | ICD-10-CM | POA: Diagnosis not present

## 2023-12-14 DIAGNOSIS — O99333 Smoking (tobacco) complicating pregnancy, third trimester: Secondary | ICD-10-CM | POA: Diagnosis not present

## 2023-12-14 DIAGNOSIS — R911 Solitary pulmonary nodule: Secondary | ICD-10-CM | POA: Diagnosis not present

## 2023-12-14 DIAGNOSIS — R591 Generalized enlarged lymph nodes: Secondary | ICD-10-CM | POA: Diagnosis not present

## 2023-12-14 LAB — CBC WITH DIFFERENTIAL/PLATELET
Abs Immature Granulocytes: 0.1 10*3/uL — ABNORMAL HIGH (ref 0.00–0.07)
Basophils Absolute: 0 10*3/uL (ref 0.0–0.1)
Basophils Relative: 0 %
Eosinophils Absolute: 0 10*3/uL (ref 0.0–0.5)
Eosinophils Relative: 0 %
HCT: 30.8 % — ABNORMAL LOW (ref 36.0–46.0)
Hemoglobin: 10.8 g/dL — ABNORMAL LOW (ref 12.0–15.0)
Immature Granulocytes: 1 %
Lymphocytes Relative: 13 %
Lymphs Abs: 1.8 10*3/uL (ref 0.7–4.0)
MCH: 32.8 pg (ref 26.0–34.0)
MCHC: 35.1 g/dL (ref 30.0–36.0)
MCV: 93.6 fL (ref 80.0–100.0)
Monocytes Absolute: 0.7 10*3/uL (ref 0.1–1.0)
Monocytes Relative: 5 %
Neutro Abs: 11.2 10*3/uL — ABNORMAL HIGH (ref 1.7–7.7)
Neutrophils Relative %: 81 %
Platelets: 168 10*3/uL (ref 150–400)
RBC: 3.29 MIL/uL — ABNORMAL LOW (ref 3.87–5.11)
RDW: 12.8 % (ref 11.5–15.5)
WBC: 13.8 10*3/uL — ABNORMAL HIGH (ref 4.0–10.5)
nRBC: 0 % (ref 0.0–0.2)

## 2023-12-14 LAB — BASIC METABOLIC PANEL WITH GFR
Anion gap: 7 (ref 5–15)
BUN: 7 mg/dL (ref 6–20)
CO2: 22 mmol/L (ref 22–32)
Calcium: 8.2 mg/dL — ABNORMAL LOW (ref 8.9–10.3)
Chloride: 104 mmol/L (ref 98–111)
Creatinine, Ser: 0.65 mg/dL (ref 0.44–1.00)
GFR, Estimated: >60 mL/min (ref >60)
Glucose, Bld: 71 mg/dL (ref 70–99)
Potassium: 4 mmol/L (ref 3.5–5.1)
Sodium: 133 mmol/L — ABNORMAL LOW (ref 135–145)

## 2023-12-14 LAB — TROPONIN I (HIGH SENSITIVITY): Troponin I (High Sensitivity): 6 ng/L (ref <18)

## 2023-12-14 MED ORDER — ACETAMINOPHEN 500 MG PO TABS
1000.0000 mg | ORAL_TABLET | Freq: Once | ORAL | Status: AC
  Administered 2023-12-14: 1000 mg via ORAL
  Filled 2023-12-14: qty 2

## 2023-12-14 MED ORDER — LIDOCAINE 5 % EX PTCH
1.0000 | MEDICATED_PATCH | CUTANEOUS | Status: DC
  Administered 2023-12-14: 1 via TRANSDERMAL
  Filled 2023-12-14: qty 1

## 2023-12-14 MED ORDER — LIDOCAINE 5 % EX PTCH: 1.0000 | MEDICATED_PATCH | Freq: Two times a day (BID) | CUTANEOUS | 0 refills | Status: AC

## 2023-12-14 MED ORDER — IOHEXOL 350 MG/ML SOLN
75.0000 mL | Freq: Once | INTRAVENOUS | Status: AC | PRN
  Administered 2023-12-14: 75 mL via INTRAVENOUS

## 2023-12-14 NOTE — ED Triage Notes (Addendum)
 Pt arrived via POV for a c/c of L lateral/back pain after getting choked on her spit/coughing when she felt a "pop". Pt states this happened a couple weeks ago and was evaluated for the same. Pt states she is [redacted]wks pregnant and also hep C positive. Pt self admin 1000mg  tylenol  PTA.

## 2023-12-14 NOTE — ED Provider Notes (Signed)
 [facility] Provider Note    Event Date/Time   First MD Initiated Contact with Patient 12/14/23 1352     (approximate)   History   Chief Complaint Rib Injury   HPI  Theresa Parker is a 33 y.o. female [id number] at approximately 31 weeks of pregnancy with past medical history of hepatitis C and prior substance abuse who presents to the ED complaining of chest pain.  Patient reports that she has had approximately 2 weeks of soreness over the left side of her chest, which got acutely worse today after she coughed.  She describes sharp and stabbing pain that is worse when she takes a deep breath.  She was seen in the ED for similar symptoms 6 days ago, had an elevated D-dimer at that time but signed out AGAINST MEDICAL ADVICE prior to imaging being performed.  She denies any associated difficulty breathing, reports a chronic "smoker's cough," and has not had any fevers.  She has not noticed any pain or swelling in her legs.      Physical Exam   Triage Vital Signs: ED Triage Vitals  Encounter Vitals Group     BP 12/14/23 1348 118/77     Systolic BP Percentile --      Diastolic BP Percentile --      Pulse Rate 12/14/23 1348 80     Resp 12/14/23 1348 16     Temp 12/14/23 1348 99 F (37.2 C)     Temp Source 12/14/23 1348 Oral     SpO2 12/14/23 1348 98 %     Weight 12/14/23 1346 142 lb (64.4 kg)     Height 12/14/23 1346 5\' 1"  (1.549 m)     Head Circumference --      Peak Flow --      Pain Score 12/14/23 1346 9     Pain Loc --      Pain Education --      Exclude from Growth Chart --     Most recent vital signs: Vitals:   12/14/23 1348 12/14/23 1710  BP: 118/77 103/61  Pulse: 80 74  Resp: 16 16  Temp: 99 F (37.2 C)   SpO2: 98% 98%    Constitutional: Alert and oriented. Eyes: Conjunctivae are normal. Head: Atraumatic. Nose: No congestion/rhinnorhea. Mouth/Throat: Mucous membranes are moist.  Cardiovascular: Normal rate, regular rhythm. Grossly  normal heart sounds.  2+ radial pulses bilaterally. Respiratory: Normal respiratory effort.  No retractions. Lungs CTAB.  Left chest wall tenderness to palpation noted. Gastrointestinal: Gravid abdomen, soft and nontender. No distention. Musculoskeletal: No lower extremity tenderness nor edema.  Neurologic:  Normal speech and language. No gross focal neurologic deficits are appreciated.    ED Results / Procedures / Treatments   Labs (all labs ordered are listed, but only abnormal results are displayed) Labs Reviewed  BASIC METABOLIC PANEL WITH GFR - Abnormal; Notable for the following components:      Result Value   Sodium 133 (*)    Calcium 8.2 (*)    All other components within normal limits  CBC WITH DIFFERENTIAL/PLATELET - Abnormal; Notable for the following components:   WBC 13.8 (*)    RBC 3.29 (*)    Hemoglobin 10.8 (*)    HCT 30.8 (*)    Neutro Abs 11.2 (*)    Abs Immature Granulocytes 0.10 (*)    All other components within normal limits  CBC WITH DIFFERENTIAL/PLATELET  TROPONIN I (HIGH SENSITIVITY)     EKG  ED ECG REPORT I, Twilla Galea, the attending physician, personally viewed and interpreted this ECG.   Date: 12/14/2023  EKG Time: 14:52  Rate: 77  Rhythm: normal sinus rhythm  Axis: Normal  Intervals:none  ST&T Change: None  RADIOLOGY CTA chest reviewed and interpreted by me with no pulmonary embolism or focal pneumonia.  PROCEDURES:  Critical Care performed: No  Procedures   MEDICATIONS ORDERED IN ED: Medications  lidocaine (LIDODERM) 5 % 1 patch (1 patch Transdermal Patch Applied 12/14/23 1457)  acetaminophen  (TYLENOL ) tablet 1,000 mg (1,000 mg Oral Given 12/14/23 1456)  iohexol (OMNIPAQUE) 350 MG/ML injection 75 mL (75 mLs Intravenous Contrast Given 12/14/23 1602)     IMPRESSION / MDM / ASSESSMENT AND PLAN / ED COURSE  I reviewed the triage vital signs and the nursing notes.                              33 y.o. female with past medical  history of hepatitis C and previous substance abuse, currently at 31 weeks of pregnancy, who presents to the ED complaining of 2 weeks of left-sided chest pain that is worse when she takes a deep breath.  Patient's presentation is most consistent with acute presentation with potential threat to life or bodily function.  Differential diagnosis includes, but is not limited to, ACS, PE, pneumonia, pneumothorax, musculoskeletal pain.  Patient nontoxic-appearing and in no acute distress, vital signs are unremarkable.  Pain is reproducible with palpation to the left chest wall and seems most likely to be musculoskeletal in etiology, however patient noted to have elevated D-dimer on prior ED visit.  Plan at that time was admission to the OB/GYN service for V/Q scan in the morning, however she signed out AGAINST MEDICAL ADVICE.  We will repeat EKG and labs today, treat symptomatically with Lidoderm patch and Tylenol .  Patient agreeable to proceed with CTA of her chest following discussion of the risks and benefits.  Case discussed with CNM Lavanda Porter, who agrees with plan and recommends transfer to labor and delivery for nonstress testing if ED workup is unremarkable.  CTA chest is negative for PE or other acute finding.  Pain improved on reassessment and patient appropriate for discharge from the ED to go to L&D for further evaluation as requested by OB/GYN.  Prescription sent for Lidoderm patches and patient counseled to continue Tylenol  as needed.      FINAL CLINICAL IMPRESSION(S) / ED DIAGNOSES   Final diagnoses:  Chest wall pain     Rx / DC Orders   ED Discharge Orders          Ordered    lidocaine (LIDODERM) 5 %  Every 12 hours        12/14/23 1802             Note:  This document was prepared using Dragon voice recognition software and may include unintentional dictation errors.   Twilla Galea, MD 12/14/23 [phone]

## 2023-12-14 NOTE — ED Notes (Signed)
 Patient was given a pillow to splint with when coughing. Patient transferred from wheelchair to stretcher independently. No dyspnea noted. Patient states she goes to [facility] for [facility] care and another high-risk OB.

## 2023-12-14 NOTE — ED Notes (Signed)
 Patient taken to imaging.

## 2023-12-14 NOTE — ED Notes (Signed)
 Patient declined discharge vital signs. Patient declined going to L&D for monitoring, stating she feels the baby move and has the appropriate number of kicks. Patient also states she has an existing OB appointment on Tuesday. Patient was encourage to come back in with any concerns. Dr. Cleora Daft is aware.

## 2023-12-14 NOTE — ED Notes (Signed)
 Fetal heart tones 134

## 2023-12-14 NOTE — ED Notes (Addendum)
 Blanket given for comfort. No dyspnea noted. Significant other is not at bedside at this time. Patient reports feeling baby move.

## 2023-12-17 DIAGNOSIS — Z716 Tobacco abuse counseling: Secondary | ICD-10-CM | POA: Diagnosis not present

## 2023-12-17 DIAGNOSIS — Z23 Encounter for immunization: Secondary | ICD-10-CM | POA: Diagnosis not present

## 2023-12-17 DIAGNOSIS — Z141 Cystic fibrosis carrier: Secondary | ICD-10-CM | POA: Insufficient documentation

## 2023-12-17 DIAGNOSIS — O24419 Gestational diabetes mellitus in pregnancy, unspecified control: Secondary | ICD-10-CM | POA: Insufficient documentation

## 2023-12-17 DIAGNOSIS — Z8759 Personal history of other complications of pregnancy, childbirth and the puerperium: Secondary | ICD-10-CM | POA: Insufficient documentation

## 2023-12-24 DIAGNOSIS — O0993 Supervision of high risk pregnancy, unspecified, third trimester: Secondary | ICD-10-CM | POA: Diagnosis not present

## 2023-12-24 DIAGNOSIS — B1920 Unspecified viral hepatitis C without hepatic coma: Secondary | ICD-10-CM | POA: Diagnosis not present

## 2023-12-24 DIAGNOSIS — B182 Chronic viral hepatitis C: Secondary | ICD-10-CM | POA: Diagnosis not present

## 2023-12-24 DIAGNOSIS — O24419 Gestational diabetes mellitus in pregnancy, unspecified control: Secondary | ICD-10-CM | POA: Diagnosis not present

## 2023-12-24 DIAGNOSIS — O98419 Viral hepatitis complicating pregnancy, unspecified trimester: Secondary | ICD-10-CM | POA: Diagnosis not present

## 2023-12-27 DIAGNOSIS — O24419 Gestational diabetes mellitus in pregnancy, unspecified control: Secondary | ICD-10-CM | POA: Diagnosis not present

## 2023-12-27 DIAGNOSIS — O0993 Supervision of high risk pregnancy, unspecified, third trimester: Secondary | ICD-10-CM | POA: Diagnosis not present

## 2023-12-30 DIAGNOSIS — Z419 Encounter for procedure for purposes other than remedying health state, unspecified: Secondary | ICD-10-CM | POA: Diagnosis not present

## 2023-12-30 DIAGNOSIS — O0993 Supervision of high risk pregnancy, unspecified, third trimester: Secondary | ICD-10-CM | POA: Diagnosis not present

## 2023-12-30 DIAGNOSIS — O24410 Gestational diabetes mellitus in pregnancy, diet controlled: Secondary | ICD-10-CM | POA: Diagnosis not present

## 2024-01-01 ENCOUNTER — Ambulatory Visit

## 2024-01-02 DIAGNOSIS — O0993 Supervision of high risk pregnancy, unspecified, third trimester: Secondary | ICD-10-CM | POA: Diagnosis not present

## 2024-01-02 DIAGNOSIS — O24410 Gestational diabetes mellitus in pregnancy, diet controlled: Secondary | ICD-10-CM | POA: Diagnosis not present

## 2024-01-02 DIAGNOSIS — O24419 Gestational diabetes mellitus in pregnancy, unspecified control: Secondary | ICD-10-CM | POA: Diagnosis not present

## 2024-01-04 ENCOUNTER — Inpatient Hospital Stay
Admission: EM | Admit: 2024-01-04 | Discharge: 2024-01-08 | DRG: 786 | Disposition: A | Discharge status: Home / Self Care

## 2024-01-04 ENCOUNTER — Encounter: Payer: Self-pay | Admitting: Obstetrics and Gynecology

## 2024-01-04 ENCOUNTER — Other Ambulatory Visit: Payer: Self-pay

## 2024-01-04 DIAGNOSIS — Z79899 Other long term (current) drug therapy: Secondary | ICD-10-CM

## 2024-01-04 DIAGNOSIS — O42913 Preterm premature rupture of membranes, unspecified as to length of time between rupture and onset of labor, third trimester: Principal | ICD-10-CM | POA: Diagnosis present

## 2024-01-04 DIAGNOSIS — O26893 Other specified pregnancy related conditions, third trimester: Secondary | ICD-10-CM | POA: Diagnosis not present

## 2024-01-04 DIAGNOSIS — Z91040 Latex allergy status: Secondary | ICD-10-CM

## 2024-01-04 DIAGNOSIS — Z833 Family history of diabetes mellitus: Secondary | ICD-10-CM

## 2024-01-04 DIAGNOSIS — O4290 Premature rupture of membranes, unspecified as to length of time between rupture and onset of labor, unspecified weeks of gestation: Principal | ICD-10-CM | POA: Diagnosis present

## 2024-01-04 DIAGNOSIS — O99334 Smoking (tobacco) complicating childbirth: Secondary | ICD-10-CM | POA: Diagnosis not present

## 2024-01-04 DIAGNOSIS — Z141 Cystic fibrosis carrier: Secondary | ICD-10-CM

## 2024-01-04 DIAGNOSIS — Z3A34 34 weeks gestation of pregnancy: Secondary | ICD-10-CM | POA: Diagnosis not present

## 2024-01-04 DIAGNOSIS — F32A Depression, unspecified: Secondary | ICD-10-CM | POA: Diagnosis not present

## 2024-01-04 DIAGNOSIS — O99344 Other mental disorders complicating childbirth: Secondary | ICD-10-CM | POA: Diagnosis present

## 2024-01-04 DIAGNOSIS — Z87898 Personal history of other specified conditions: Secondary | ICD-10-CM

## 2024-01-04 DIAGNOSIS — G4700 Insomnia, unspecified: Secondary | ICD-10-CM | POA: Diagnosis present

## 2024-01-04 DIAGNOSIS — O0933 Supervision of pregnancy with insufficient antenatal care, third trimester: Secondary | ICD-10-CM | POA: Diagnosis not present

## 2024-01-04 DIAGNOSIS — O99324 Drug use complicating childbirth: Secondary | ICD-10-CM | POA: Diagnosis present

## 2024-01-04 DIAGNOSIS — D62 Acute posthemorrhagic anemia: Secondary | ICD-10-CM | POA: Diagnosis not present

## 2024-01-04 DIAGNOSIS — F319 Bipolar disorder, unspecified: Secondary | ICD-10-CM | POA: Diagnosis present

## 2024-01-04 DIAGNOSIS — F1290 Cannabis use, unspecified, uncomplicated: Secondary | ICD-10-CM | POA: Diagnosis present

## 2024-01-04 DIAGNOSIS — O9081 Anemia of the puerperium: Secondary | ICD-10-CM | POA: Diagnosis not present

## 2024-01-04 DIAGNOSIS — Z8759 Personal history of other complications of pregnancy, childbirth and the puerperium: Secondary | ICD-10-CM

## 2024-01-04 DIAGNOSIS — O24419 Gestational diabetes mellitus in pregnancy, unspecified control: Secondary | ICD-10-CM | POA: Diagnosis present

## 2024-01-04 DIAGNOSIS — B182 Chronic viral hepatitis C: Secondary | ICD-10-CM | POA: Diagnosis not present

## 2024-01-04 DIAGNOSIS — O24420 Gestational diabetes mellitus in childbirth, diet controlled: Secondary | ICD-10-CM | POA: Diagnosis present

## 2024-01-04 DIAGNOSIS — O9842 Viral hepatitis complicating childbirth: Secondary | ICD-10-CM | POA: Diagnosis present

## 2024-01-04 DIAGNOSIS — O42013 Preterm premature rupture of membranes, onset of labor within 24 hours of rupture, third trimester: Secondary | ICD-10-CM | POA: Diagnosis not present

## 2024-01-04 DIAGNOSIS — F419 Anxiety disorder, unspecified: Secondary | ICD-10-CM | POA: Diagnosis present

## 2024-01-04 DIAGNOSIS — Z8249 Family history of ischemic heart disease and other diseases of the circulatory system: Secondary | ICD-10-CM | POA: Diagnosis not present

## 2024-01-04 DIAGNOSIS — F17210 Nicotine dependence, cigarettes, uncomplicated: Secondary | ICD-10-CM | POA: Diagnosis not present

## 2024-01-04 DIAGNOSIS — O9843 Viral hepatitis complicating the puerperium: Secondary | ICD-10-CM | POA: Diagnosis not present

## 2024-01-04 DIAGNOSIS — O99335 Smoking (tobacco) complicating the puerperium: Secondary | ICD-10-CM | POA: Diagnosis not present

## 2024-01-04 DIAGNOSIS — F17200 Nicotine dependence, unspecified, uncomplicated: Secondary | ICD-10-CM | POA: Diagnosis present

## 2024-01-04 DIAGNOSIS — O99325 Drug use complicating the puerperium: Secondary | ICD-10-CM | POA: Diagnosis not present

## 2024-01-04 LAB — WET PREP, GENITAL
Clue Cells Wet Prep HPF POC: NONE SEEN
Sperm: NONE SEEN
Trich, Wet Prep: NONE SEEN
WBC, Wet Prep HPF POC: <10 (ref <10)
Yeast Wet Prep HPF POC: NONE SEEN

## 2024-01-04 LAB — URINALYSIS, COMPLETE (UACMP) WITH MICROSCOPIC
Glucose, UA: NEGATIVE mg/dL
Ketones, ur: NEGATIVE mg/dL
Nitrite: NEGATIVE
Protein, ur: 100 mg/dL — AB
Specific Gravity, Urine: 1.029 (ref 1.005–1.030)
pH: 5 (ref 5.0–8.0)

## 2024-01-04 LAB — TYPE AND SCREEN
ABO/RH(D): A POS
Antibody Screen: NEGATIVE

## 2024-01-04 LAB — COMPREHENSIVE METABOLIC PANEL WITH GFR
ALT: 16 U/L (ref 0–44)
AST: 23 U/L (ref 15–41)
Albumin: 2.6 g/dL — ABNORMAL LOW (ref 3.5–5.0)
Alkaline Phosphatase: 108 U/L (ref 38–126)
Anion gap: 8 (ref 5–15)
BUN: 9 mg/dL (ref 6–20)
CO2: 23 mmol/L (ref 22–32)
Calcium: 8.5 mg/dL — ABNORMAL LOW (ref 8.9–10.3)
Chloride: 104 mmol/L (ref 98–111)
Creatinine, Ser: 0.69 mg/dL (ref 0.44–1.00)
GFR, Estimated: >60 mL/min (ref >60)
Glucose, Bld: 104 mg/dL — ABNORMAL HIGH (ref 70–99)
Potassium: 3.7 mmol/L (ref 3.5–5.1)
Sodium: 135 mmol/L (ref 135–145)
Total Bilirubin: 0.5 mg/dL (ref 0.0–1.2)
Total Protein: 6.3 g/dL — ABNORMAL LOW (ref 6.5–8.1)

## 2024-01-04 LAB — CBC
HCT: 32.6 % — ABNORMAL LOW (ref 36.0–46.0)
Hemoglobin: 11.3 g/dL — ABNORMAL LOW (ref 12.0–15.0)
MCH: 32.1 pg (ref 26.0–34.0)
MCHC: 34.7 g/dL (ref 30.0–36.0)
MCV: 92.6 fL (ref 80.0–100.0)
Platelets: 167 10*3/uL (ref 150–400)
RBC: 3.52 MIL/uL — ABNORMAL LOW (ref 3.87–5.11)
RDW: 13.2 % (ref 11.5–15.5)
WBC: 14.7 10*3/uL — ABNORMAL HIGH (ref 4.0–10.5)
nRBC: 0 % (ref 0.0–0.2)

## 2024-01-04 LAB — URINE DRUG SCREEN, QUALITATIVE (ARMC ONLY)
Amphetamines, Ur Screen: NOT DETECTED
Barbiturates, Ur Screen: NOT DETECTED
Benzodiazepine, Ur Scrn: NOT DETECTED
Cannabinoid 50 Ng, Ur ~~LOC~~: POSITIVE — AB
Cocaine Metabolite,Ur ~~LOC~~: NOT DETECTED
MDMA (Ecstasy)Ur Screen: NOT DETECTED
Methadone Scn, Ur: NOT DETECTED
Opiate, Ur Screen: NOT DETECTED
Phencyclidine (PCP) Ur S: NOT DETECTED
Tricyclic, Ur Screen: POSITIVE — AB

## 2024-01-04 LAB — ABO/RH: ABO/RH(D): A POS

## 2024-01-04 LAB — RUPTURE OF MEMBRANE (ROM)PLUS: Rom Plus: POSITIVE

## 2024-01-04 LAB — GLUCOSE, CAPILLARY: Glucose-Capillary: 87 mg/dL (ref 70–99)

## 2024-01-04 MED ORDER — OXYTOCIN-SODIUM CHLORIDE 30-0.9 UT/500ML-% IV SOLN
1.0000 m[IU]/min | INTRAVENOUS | Status: DC
  Administered 2024-01-05: 2 m[IU]/min via INTRAVENOUS
  Filled 2024-01-04 (×2): qty 500

## 2024-01-04 MED ORDER — OXYTOCIN BOLUS FROM INFUSION: 333.0000 mL | Freq: Once | INTRAVENOUS | Status: DC

## 2024-01-04 MED ORDER — OXYTOCIN-SODIUM CHLORIDE 30-0.9 UT/500ML-% IV SOLN: 2.5000 [IU]/h | INTRAVENOUS | Status: DC

## 2024-01-04 MED ORDER — PENICILLIN G POT IN DEXTROSE 60000 UNIT/ML IV SOLN: 3.0000 10*6.[IU] | INTRAVENOUS | Status: DC

## 2024-01-04 MED ORDER — AMMONIA AROMATIC IN INHA
RESPIRATORY_TRACT | Status: AC
  Filled 2024-01-04: qty 10

## 2024-01-04 MED ORDER — LACTATED RINGERS IV SOLN: 500.0000 mL | INTRAVENOUS | Status: DC | PRN

## 2024-01-04 MED ORDER — SODIUM CHLORIDE 0.9 % IV SOLN: 250.0000 mL | INTRAVENOUS | Status: DC | PRN

## 2024-01-04 MED ORDER — LACTATED RINGERS IV BOLUS
1000.0000 mL | Freq: Once | INTRAVENOUS | Status: AC
  Administered 2024-01-04: 1000 mL via INTRAVENOUS

## 2024-01-04 MED ORDER — MISOPROSTOL 200 MCG PO TABS
ORAL_TABLET | ORAL | Status: AC
  Filled 2024-01-04: qty 4

## 2024-01-04 MED ORDER — LIDOCAINE HCL (PF) 1 % IJ SOLN
INTRAMUSCULAR | Status: AC
  Filled 2024-01-04: qty 30

## 2024-01-04 MED ORDER — FENTANYL CITRATE (PF) 100 MCG/2ML IJ SOLN
50.0000 ug | INTRAMUSCULAR | Status: DC | PRN
  Administered 2024-01-05: 50 ug via INTRAVENOUS
  Filled 2024-01-04: qty 2

## 2024-01-04 MED ORDER — ONDANSETRON HCL 4 MG/2ML IJ SOLN: 4.0000 mg | Freq: Four times a day (QID) | INTRAMUSCULAR | Status: DC | PRN

## 2024-01-04 MED ORDER — SOD CITRATE-CITRIC ACID 500-334 MG/5ML PO SOLN: 30.0000 mL | ORAL | Status: DC | PRN

## 2024-01-04 MED ORDER — SODIUM CHLORIDE 0.9% FLUSH: 3.0000 mL | Freq: Two times a day (BID) | INTRAVENOUS | Status: DC

## 2024-01-04 MED ORDER — LACTATED RINGERS IV SOLN: INTRAVENOUS | Status: DC

## 2024-01-04 MED ORDER — ESCITALOPRAM OXALATE 10 MG PO TABS
10.0000 mg | ORAL_TABLET | Freq: Every day | ORAL | Status: DC
  Filled 2024-01-04: qty 1

## 2024-01-04 MED ORDER — ACETAMINOPHEN 500 MG PO TABS: 1000.0000 mg | ORAL_TABLET | Freq: Four times a day (QID) | ORAL | Status: DC | PRN

## 2024-01-04 MED ORDER — LIDOCAINE HCL (PF) 1 % IJ SOLN: 30.0000 mL | INTRAMUSCULAR | Status: DC | PRN

## 2024-01-04 MED ORDER — BETAMETHASONE SOD PHOS & ACET 6 (3-3) MG/ML IJ SUSP
12.0000 mg | INTRAMUSCULAR | Status: DC
  Administered 2024-01-04: 12 mg via INTRAMUSCULAR
  Filled 2024-01-04: qty 5

## 2024-01-04 MED ORDER — OXYTOCIN 10 UNIT/ML IJ SOLN
INTRAMUSCULAR | Status: AC
  Filled 2024-01-04: qty 2

## 2024-01-04 MED ORDER — SODIUM CHLORIDE 0.9% FLUSH: 3.0000 mL | INTRAVENOUS | Status: DC | PRN

## 2024-01-04 MED ORDER — SODIUM CHLORIDE 0.9 % IV SOLN
5.0000 10*6.[IU] | Freq: Once | INTRAVENOUS | Status: AC
  Administered 2024-01-04: 5 10*6.[IU] via INTRAVENOUS
  Filled 2024-01-04: qty 5

## 2024-01-04 MED ORDER — TERBUTALINE SULFATE 1 MG/ML IJ SOLN
0.2500 mg | Freq: Once | INTRAMUSCULAR | Status: AC | PRN
  Administered 2024-01-05: 0.25 mg via SUBCUTANEOUS
  Filled 2024-01-04: qty 1

## 2024-01-04 NOTE — H&P (Signed)
 OB History & Physical   History of Present Illness:   Chief Complaint: Leakage of amniotic fluid   HPI:  Theresa Parker is a 33 y.o. [id number] female at [redacted]w[redacted]d, Patient's last menstrual period was 05/11/2023 (exact date)., consistent with US  at [redacted]w[redacted]d, with Estimated Date of Delivery: 02/15/24.  She presents to L&D for leakage of amniotic fluid. She reports her "water broke" approximately around 1730 this afternoon (01/04/2024). Denies vaginal bleeding. Reports mild contractions and active fetal movement.    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  Insomnia Chronic Hepatitis C  Late to prenatal care- NOB @ 19wks Lapse in care- no care between 23 weeks and 32 weeks  H/o PPROM/PTD @ 36 weeks   Prenatal Transfer Tool  Maternal Diabetes: Yes:  Diabetes Type:  Diet controlled Genetic Screening: Normal Maternal Ultrasounds/Referrals: Normal Fetal Ultrasounds or other Referrals:  None Maternal Substance Abuse:  Yes:  Type: Marijuana Significant Maternal Medications:  Meds include: Other: Lexapro 10 mg  Significant Maternal Lab Results: GBS: Pending   Maternal Medical History:   Past Medical History:  Diagnosis Date   History of dizziness    History of hepatitis C    History of intravenous drug use in remission    Migraine    Nausea    Vaginal discharge     Past Surgical History:  Procedure Laterality Date   APPENDECTOMY     BUNIONECTOMY      Allergies  Allergen Reactions   Latex Other (See Comments)    Latex condoms cause yeast infections / vaginal irritation    Prior to Admission medications   Medication Sig Start Date End Date Taking? Authorizing Provider  escitalopram  (LEXAPRO) 10 MG tablet Take 10 mg by mouth daily.   Yes [provider]  lidocaine  (LIDODERM ) 5 % Place 1 patch onto the skin every 12 (twelve) hours. Remove & Discard patch within 12 hours or as directed by MD 12/14/23 12/13/24 Yes Twilla Galea, MD  Prenatal Vit-Fe Fumarate-FA (M-NATAL PLUS) 27-1 MG TABS Take 1 tablet by mouth daily. 06/13/23  Yes [provider]  busPIRone (BUSPAR) 5 MG tablet Take 1 tablet by mouth 2 (two) times daily. Patient not taking: Reported on 01/04/2024 10/30/23 10/29/24  [provider]  doxycycline  (MONODOX ) 100 MG capsule Take one cap po BID x 7 days. Then continue to take one cap po QD x 3 weeks. Take with food. Patient not taking: Reported on 06/17/2023 12/26/22   Elta Halter, MD  doxycycline  (MONODOX ) 100 MG capsule Take one cap po QD x 2 mths. Take with food. Patient not taking: Reported on 06/17/2023 12/26/22   Elta Halter, MD  hydrOXYzine (ATARAX) 25 MG tablet Take 25 mg by mouth 3 (three) times daily. Patient not taking: Reported on 06/17/2023 12/26/22   [provider]  mirtazapine (REMERON) 7.5 MG tablet Take by mouth. Patient not taking: Reported on 06/17/2023 03/01/22   [provider]  nicotine (NICODERM CQ - DOSED IN MG/24 HR) 7 mg/24hr patch 7 mg daily. Patient not taking: Reported on 01/04/2024 09/23/23   [provider]  promethazine (PHENERGAN) 25 MG tablet Take 25 mg by mouth every 6 (six) hours as needed. Patient not taking: Reported on 01/04/2024  09/23/23   [provider]  traZODone (DESYREL) 50 MG tablet Take 50-100 mg by mouth at bedtime as needed. Patient not taking: Reported on 06/17/2023 02/16/22   [provider]  traZODone (DESYREL) 50 MG tablet Take 1 tablet by mouth at bedtime. 10/30/23 10/29/24  [provider]  venlafaxine XR (EFFEXOR-XR) 75 MG 24 hr capsule Take 225 mg by mouth daily. 02/28/22   [provider]     Prenatal care site:  [facility]  OB History  Gravida Para Term Preterm AB Living  4 2 0 2 1 2   SAB IAB Ectopic Multiple Live Births  1 0 0 0 0    # Outcome Date GA Lbr Len/2nd Weight Sex Type Anes PTL Lv  4 Current           3 Preterm 07/03/16 [redacted]w[redacted]d  2892 g M         Birth Comments: [facility]  2 SAB 12/2013 [redacted]w[redacted]d            Birth Comments: Evaluated at "hospital in [place]"  1 Preterm 01/24/12 [redacted]w[redacted]d  2849 g F  EPI       Birth Comments: [facility]     Social History: She  reports that she has been smoking cigarettes and e-cigarettes. She has a 21 pack-year smoking history. She has never used smokeless tobacco. She reports that she does not currently use alcohol. She reports that she does not currently use drugs after having used the following drugs: "Crack" cocaine, Heroin, and Marijuana.  Family History: family history includes Cervical cancer in her sister; Depression in her father and paternal grandfather; Diabetes in her paternal grandfather; Endometriosis in her mother; Heart disease in her paternal grandfather; Hypertension in her paternal grandfather; Leukemia in her paternal grandfather.   Review of Systems: A full review of systems was performed and negative except as noted in the HPI.     Physical Exam:  Vital Signs: BP 111/77   Pulse (!) 105   Temp 98.7 F (37.1 C) (Oral)   LMP 05/11/2023 (Exact Date)   General: no acute distress.  HEENT: normocephalic, atraumatic Heart: regular rate & rhythm Lungs: normal respiratory effort Abdomen: soft, gravid, non-tender Pelvic:   External: Normal external female genitalia  Cervix: 30/50/-1   Extremities: non-tender, symmetric, no edema bilaterally.  DTRs: 2+  Neurologic: Alert & oriented x 3.    Results for orders placed or performed during the hospital encounter of 01/04/24 (from the past 24 hours)  Urinalysis, Complete w Microscopic -Urine, Clean Catch     Status: Abnormal   Collection  Time: 01/04/24  9:35 PM  Result Value Ref Range   Color, Urine AMBER (A) YELLOW   APPearance CLOUDY (A) CLEAR   Specific Gravity, Urine 1.029 1.005 - 1.030   pH 5.0 5.0 - 8.0   Glucose, UA NEGATIVE NEGATIVE mg/dL   Hgb urine dipstick SMALL (A) NEGATIVE   Bilirubin Urine SMALL (A) NEGATIVE   Ketones, ur NEGATIVE NEGATIVE mg/dL   Protein, ur 161 (A) NEGATIVE mg/dL   Nitrite NEGATIVE NEGATIVE   Leukocytes,Ua SMALL (A) NEGATIVE   RBC / HPF 11-20 0 - 5 RBC/hpf   WBC, UA 21-50 0 - 5 WBC/hpf   Bacteria, UA RARE (A) NONE SEEN   Squamous Epithelial / HPF 21-50 0 - 5 /HPF   Mucus PRESENT    Hyaline Casts, UA PRESENT   Urine Drug Screen, Qualitative ([facility] only)  Status: Abnormal   Collection Time: 01/04/24  9:35 PM  Result Value Ref Range   Tricyclic, Ur Screen POSITIVE (A) NONE DETECTED   Amphetamines, Ur Screen NONE DETECTED NONE DETECTED   MDMA (Ecstasy)Ur Screen NONE DETECTED NONE DETECTED   Cocaine Metabolite,Ur [place] NONE DETECTED NONE DETECTED   Opiate, Ur Screen NONE DETECTED NONE DETECTED   Phencyclidine (PCP) Ur S NONE DETECTED NONE DETECTED   Cannabinoid 50 Ng, Ur [place] POSITIVE (A) NONE DETECTED   Barbiturates, Ur Screen NONE DETECTED NONE DETECTED   Benzodiazepine, Ur Scrn NONE DETECTED NONE DETECTED   Methadone Scn, Ur NONE DETECTED NONE DETECTED  Wet prep, genital     Status: None   Collection Time: 01/04/24  9:35 PM  Result Value Ref Range   Yeast Wet Prep HPF POC NONE SEEN NONE SEEN   Trich, Wet Prep NONE SEEN NONE SEEN   Clue Cells Wet Prep HPF POC NONE SEEN NONE SEEN   WBC, Wet Prep HPF POC <10 <10   Sperm NONE SEEN   Rupture of Membrane (ROM) Plus     Status: None   Collection Time: 01/04/24  9:35 PM  Result Value Ref Range   Rom Plus POSITIVE   CBC     Status: Abnormal   Collection Time: 01/04/24 10:31 PM  Result Value Ref Range   WBC 14.7 (H) 4.0 - 10.5 K/uL   RBC 3.52 (L) 3.87 - 5.11 MIL/uL   Hemoglobin 11.3 (L) 12.0 - 15.0 g/dL   HCT 78.2 (L) 95.6 - 21.3 %    MCV 92.6 80.0 - 100.0 fL   MCH 32.1 26.0 - 34.0 pg   MCHC 34.7 30.0 - 36.0 g/dL   RDW 08.6 57.8 - 46.9 %   Platelets 167 150 - 400 K/uL   nRBC 0.0 0.0 - 0.2 %  Type and screen [facility]     Status: None (Preliminary result)   Collection Time: 01/04/24 10:31 PM  Result Value Ref Range   ABO/RH(D) PENDING    Antibody Screen PENDING    Sample Expiration      01/07/2024,2359 Performed at [facility], [address] [place], [place] [address]   Comprehensive metabolic panel with GFR     Status: Abnormal   Collection Time: 01/04/24 10:31 PM  Result Value Ref Range   Sodium 135 135 - 145 mmol/L   Potassium 3.7 3.5 - 5.1 mmol/L   Chloride 104 98 - 111 mmol/L   CO2 23 22 - 32 mmol/L   Glucose, Bld 104 (H) 70 - 99 mg/dL   BUN 9 6 - 20 mg/dL   Creatinine, Ser 8.41 0.44 - 1.00 mg/dL   Calcium 8.5 (L) 8.9 - 10.3 mg/dL   Total Protein 6.3 (L) 6.5 - 8.1 g/dL   Albumin 2.6 (L) 3.5 - 5.0 g/dL   AST 23 15 - 41 U/L   ALT 16 0 - 44 U/L   Alkaline Phosphatase 108 38 - 126 U/L   Total Bilirubin 0.5 0.0 - 1.2 mg/dL   GFR, Estimated >32 >44 mL/min   Anion gap 8 5 - 15  ABO/Rh     Status: None (Preliminary result)   Collection Time: 01/04/24 10:35 PM  Result Value Ref Range   ABO/RH(D) PENDING   Glucose, capillary     Status: None   Collection Time: 01/04/24 11:07 PM  Result Value Ref Range   Glucose-Capillary 87 70 - 99 mg/dL    Pertinent Results:  Prenatal Labs:  Blood type/Rh A Positive   Antibody screen Negative    Rubella Immune    Varicella Immune  RPR Non Reactive    HBsAg Negative   Hep C Non Reactive   HIV Negative    GC Pending   Chlamydia Pending   Genetic screening cfDNA negative   1 hour GTT 198  3 hour GTT N/A  GBS Pending      FHT:  FHR: 145 bpm, variability: moderate,  accelerations:  Present,  decelerations:  Absent Category/reactivity:  Category I UC:   regular, every 1-8 minutes   Cephalic by SVE and US    CT Angio Chest  PE W/Cm &/Or Wo Cm Result Date: 12/14/2023 CLINICAL DATA:  Left lateral and posterior chest pain after getting choked on spit with coughing and feeling a popping sensation 2 weeks ago. Thirty-one weeks pregnant. Smoker. EXAM: CT ANGIOGRAPHY CHEST WITH CONTRAST TECHNIQUE: Multidetector CT imaging of the chest was performed using the standard protocol during bolus administration of intravenous contrast. Multiplanar CT image reconstructions and MIPs were obtained to evaluate the vascular anatomy. RADIATION DOSE REDUCTION: This exam was performed according to the departmental dose-optimization program which includes automated exposure control, adjustment of the mA and/or kV according to patient size and/or use of iterative reconstruction technique. CONTRAST:  53mL OMNIPAQUE  IOHEXOL  350 MG/ML SOLN COMPARISON:  Portable chest dated 12/08/2023 FINDINGS: Cardiovascular: Satisfactory opacification of the pulmonary arteries to the segmental level. No evidence of pulmonary embolism. Normal heart size. No pericardial effusion. Mediastinum/Nodes: Mildly enlarged mediastinal lymph nodes including an 11 mm short axis AP node on image number 38/4, 11 mm short axis subcarinal node on image number 49/4 and 17 mm short axis right hilar node on image number 50/4. No enlarged left hilar, axillary or supraclavicular nodes. Unremarkable thyroid gland, trachea and esophagus. Lungs/Pleura: Mild biapical paraseptal bullous changes with pleural and parenchymal scarring. Mild-to-moderate diffuse peribronchial thickening. No airspace consolidation or pleural fluid. 7 mm noncalcified nodule in the left lower lobe on image number 101/6. Upper Abdomen: Unremarkable. Musculoskeletal: Mild-to-moderate disc space narrowing and anterior disc bulging and spur formation at the T11-12 level. No rib fractures seen. Review of the MIP images confirms the above findings. IMPRESSION: 1. No evidence of pulmonary embolism. 2. 7 mm left lower lobe nodule. Since  the patient is a long-time, 21 pack-year smoker, recommend a follow-up chest CT in 6-12 months. 3. Mild-to-moderate bronchitic changes. 4. Mild changes of COPD. 5. Mild mediastinal and right hilar lymphadenopathy, likely reactive. Aortic Atherosclerosis (ICD10-I70.0) and Emphysema (ICD10-J43.9). Electronically Signed   By: Catherin Closs M.D.   On: 12/14/2023 17:32   DG Chest 1 View Result Date: 12/08/2023 CLINICAL DATA:  Left upper chest pains. EXAM: CHEST  1 VIEW COMPARISON:  None Available. FINDINGS: The heart size and mediastinal contours are within normal limits. Both lungs are clear. No pneumothorax is seen. The visualized skeletal structures are unremarkable. IMPRESSION: No active disease. Electronically Signed   By: Denman Fischer M.D.   On: 12/08/2023 01:22    Assessment:  Theresa Parker is a 33 y.o. [id number] female at [redacted]w[redacted]d admitted for PPROM at [redacted]w[redacted]d.   Plan:  1. Admit to Labor & Delivery - Consents reviewed and obtained - B.Alvia Awkward, MD notified of admission and plan of care   2. Fetal Well being  - Fetal Tracing: Category I - Group B Streptococcus ppx indicated: GBS: Pending   - GBS prophylaxis initated d/t pending labs - Presentation: Cephalic confirmed by SVE and US    3. Routine  OB: - Prenatal labs reviewed, as above - Rh positive - CBC, T&S, RPR on admit - Clear liquid diet , continuous IV fluids  4. Monitoring of labor  - Contractions monitored with external toco - Pelvis adequate for trial of labor  - Patient offered the choice between expectant management or immediate delivery.    Benefits and risks discussed with patient. Patient opted for immediate delivery.  - Augmentation with oxytocin as appropriate  - Plan for  continuous fetal monitoring - Maternal pain control as desired; planning regional anesthesia - Anticipate vaginal delivery  5. PPROM - ROM Plus collected (01/04/2024); Results: Positive - Pooling of clear fluid visualized on speculum exam - SVE:  3/50/-1 - Per [email or web address], patient eligible for late preterm antenatal steroids  - Betamethasone IM 12 mg x 2 q 24 hrs ordered  - Wet Prep collected (01/04/2024); Results: Negative - GC/C collected (01/04/2024); Results: Pending - UA collected (01/04/2024); Results: WNL  6. A1GDM- Diet controlled  - Glucose, capillary collected on admit (01/04/2024); Results: 87 - CBG monitoring q4hrs, then q1hr in active labor (> or equal to 6cm)  7. Anxiety and Depression - Lexapro 10 mg PO daily   8. History of Substance Use- Marijuana in pregnancy  - UDS ordered on admit (01/04/2024); Results: Positive for Tricyclic and    Cannabinoid  9. Chronic Hepatitis C - Hepatitis C antibody ordered (01/04/2024); Results: Pending   10. Post Partum Planning: - Infant feeding: Undecided  - Contraception: Undecided  - Flu vaccine: Declined  - Tdap vaccine: 12/17/2023   Keiston Manley, CNM 01/04/24 11:12 PM  Janeene Sand, CNM Certified Nurse Midwife [facility]

## 2024-01-04 NOTE — OB Triage Note (Signed)
 Patient is G4P2 is [redacted]w[redacted]d is seen as KC. Patient today thinks her water broke around 530 pm. Patient VSS, FHR 150 good variability. Provider to be notified.

## 2024-01-05 ENCOUNTER — Encounter: Payer: Self-pay | Admitting: Obstetrics and Gynecology

## 2024-01-05 ENCOUNTER — Inpatient Hospital Stay: Admitting: General Practice

## 2024-01-05 ENCOUNTER — Other Ambulatory Visit: Payer: Self-pay

## 2024-01-05 ENCOUNTER — Encounter: Admission: EM | Disposition: A | Discharge status: Home / Self Care | Payer: Self-pay | Source: Home / Self Care

## 2024-01-05 LAB — CHLAMYDIA/NGC RT PCR (ARMC ONLY)
Chlamydia Tr: NOT DETECTED
N gonorrhoeae: NOT DETECTED

## 2024-01-05 LAB — GLUCOSE, CAPILLARY: Glucose-Capillary: 123 mg/dL — ABNORMAL HIGH (ref 70–99)

## 2024-01-05 LAB — HEPATITIS C ANTIBODY: HCV Ab: REACTIVE — AB

## 2024-01-05 SURGERY — Surgical Case
Anesthesia: Spinal

## 2024-01-05 MED ORDER — TETANUS-DIPHTH-ACELL PERTUSSIS 5-2.5-18.5 LF-MCG/0.5 IM SUSY
0.5000 mL | PREFILLED_SYRINGE | Freq: Once | INTRAMUSCULAR | Status: DC
  Filled 2024-01-05: qty 0.5

## 2024-01-05 MED ORDER — PHENYLEPHRINE HCL-NACL 20-0.9 MG/250ML-% IV SOLN
INTRAVENOUS | Status: AC
  Filled 2024-01-05: qty 250

## 2024-01-05 MED ORDER — BUPIVACAINE IN DEXTROSE 0.75-8.25 % IT SOLN
INTRATHECAL | Status: DC | PRN
  Administered 2024-01-05: 1.4 mL via INTRATHECAL

## 2024-01-05 MED ORDER — LACTATED RINGERS IV SOLN: INTRAVENOUS | Status: DC

## 2024-01-05 MED ORDER — PRENATAL MULTIVITAMIN CH
1.0000 | ORAL_TABLET | Freq: Every day | ORAL | Status: DC
  Administered 2024-01-05 – 2024-01-08 (×4): 1 via ORAL
  Filled 2024-01-05 (×4): qty 1

## 2024-01-05 MED ORDER — FENTANYL CITRATE (PF) 100 MCG/2ML IJ SOLN
INTRAMUSCULAR | Status: AC
  Filled 2024-01-05: qty 2

## 2024-01-05 MED ORDER — MORPHINE SULFATE (PF) 0.5 MG/ML IJ SOLN
INTRAMUSCULAR | Status: AC
  Filled 2024-01-05: qty 10

## 2024-01-05 MED ORDER — OXYTOCIN-SODIUM CHLORIDE 30-0.9 UT/500ML-% IV SOLN
INTRAVENOUS | Status: DC | PRN
  Administered 2024-01-05: 250 mL/h via INTRAVENOUS

## 2024-01-05 MED ORDER — SODIUM CHLORIDE 0.9 % IV SOLN
INTRAVENOUS | Status: DC | PRN
  Administered 2024-01-05: 500 mg via INTRAVENOUS

## 2024-01-05 MED ORDER — ACETAMINOPHEN 500 MG PO TABS
1000.0000 mg | ORAL_TABLET | Freq: Four times a day (QID) | ORAL | Status: DC
  Administered 2024-01-06 – 2024-01-08 (×9): 1000 mg via ORAL
  Filled 2024-01-05 (×10): qty 2

## 2024-01-05 MED ORDER — DIPHENHYDRAMINE HCL 50 MG/ML IJ SOLN
12.5000 mg | INTRAMUSCULAR | Status: DC | PRN
  Administered 2024-01-05: 12.5 mg via INTRAVENOUS
  Filled 2024-01-05: qty 1

## 2024-01-05 MED ORDER — SODIUM CHLORIDE 0.9% FLUSH: 3.0000 mL | INTRAVENOUS | Status: DC | PRN

## 2024-01-05 MED ORDER — ESCITALOPRAM OXALATE 10 MG PO TABS
10.0000 mg | ORAL_TABLET | Freq: Every day | ORAL | Status: DC
  Administered 2024-01-05 – 2024-01-08 (×4): 10 mg via ORAL
  Filled 2024-01-05 (×5): qty 1

## 2024-01-05 MED ORDER — DIPHENHYDRAMINE HCL 25 MG PO CAPS: 25.0000 mg | ORAL_CAPSULE | ORAL | Status: DC | PRN

## 2024-01-05 MED ORDER — OXYTOCIN-SODIUM CHLORIDE 30-0.9 UT/500ML-% IV SOLN
2.5000 [IU]/h | INTRAVENOUS | Status: AC
  Administered 2024-01-05: 2.5 [IU]/h via INTRAVENOUS

## 2024-01-05 MED ORDER — TRAZODONE HCL 50 MG PO TABS
50.0000 mg | ORAL_TABLET | Freq: Every day | ORAL | Status: DC
  Administered 2024-01-05 – 2024-01-07 (×3): 50 mg via ORAL
  Filled 2024-01-05 (×5): qty 1

## 2024-01-05 MED ORDER — METOCLOPRAMIDE HCL 5 MG/ML IJ SOLN
INTRAMUSCULAR | Status: DC | PRN
  Administered 2024-01-05: 10 mg via INTRAVENOUS

## 2024-01-05 MED ORDER — FENTANYL CITRATE (PF) 100 MCG/2ML IJ SOLN
INTRAMUSCULAR | Status: DC | PRN
  Administered 2024-01-05: 10 ug via INTRATHECAL

## 2024-01-05 MED ORDER — OXYCODONE HCL 5 MG PO TABS
5.0000 mg | ORAL_TABLET | ORAL | Status: DC | PRN
  Administered 2024-01-07 – 2024-01-08 (×6): 10 mg via ORAL
  Filled 2024-01-05: qty 1
  Filled 2024-01-05 (×3): qty 2
  Filled 2024-01-05: qty 1
  Filled 2024-01-05 (×2): qty 2

## 2024-01-05 MED ORDER — FERROUS SULFATE 325 (65 FE) MG PO TABS
325.0000 mg | ORAL_TABLET | Freq: Two times a day (BID) | ORAL | Status: DC
  Administered 2024-01-05 – 2024-01-08 (×8): 325 mg via ORAL
  Filled 2024-01-05 (×9): qty 1

## 2024-01-05 MED ORDER — COCONUT OIL OIL
1.0000 | TOPICAL_OIL | Status: DC | PRN
  Filled 2024-01-05: qty 7.5

## 2024-01-05 MED ORDER — PHENYLEPHRINE HCL-NACL 20-0.9 MG/250ML-% IV SOLN
INTRAVENOUS | Status: DC | PRN
  Administered 2024-01-05: 40 ug/min via INTRAVENOUS

## 2024-01-05 MED ORDER — SENNOSIDES-DOCUSATE SODIUM 8.6-50 MG PO TABS
2.0000 | ORAL_TABLET | ORAL | Status: DC
  Administered 2024-01-05 – 2024-01-08 (×4): 2 via ORAL
  Filled 2024-01-05 (×4): qty 2

## 2024-01-05 MED ORDER — IBUPROFEN 600 MG PO TABS
600.0000 mg | ORAL_TABLET | Freq: Four times a day (QID) | ORAL | Status: DC
  Administered 2024-01-06 – 2024-01-08 (×9): 600 mg via ORAL
  Filled 2024-01-05 (×9): qty 1

## 2024-01-05 MED ORDER — WITCH HAZEL-GLYCERIN EX PADS: 1.0000 | MEDICATED_PAD | CUTANEOUS | Status: DC | PRN

## 2024-01-05 MED ORDER — ONDANSETRON HCL 4 MG/2ML IJ SOLN
INTRAMUSCULAR | Status: DC | PRN
Start: 2024-01-05 — End: 2024-01-05
  Administered 2024-01-05: 4 mg via INTRAVENOUS

## 2024-01-05 MED ORDER — OXYCODONE HCL 5 MG PO TABS: 5.0000 mg | ORAL_TABLET | Freq: Four times a day (QID) | ORAL | Status: DC | PRN

## 2024-01-05 MED ORDER — HYDROXYZINE HCL 25 MG PO TABS
25.0000 mg | ORAL_TABLET | Freq: Once | ORAL | Status: AC
  Administered 2024-01-05: 25 mg via ORAL
  Filled 2024-01-05: qty 1

## 2024-01-05 MED ORDER — FLEET ENEMA RE ENEM: 1.0000 | ENEMA | Freq: Every day | RECTAL | Status: DC | PRN

## 2024-01-05 MED ORDER — SOD CITRATE-CITRIC ACID 500-334 MG/5ML PO SOLN
ORAL | Status: AC
Start: 2024-01-05 — End: 2024-01-05
  Filled 2024-01-05: qty 15

## 2024-01-05 MED ORDER — KETOROLAC TROMETHAMINE 30 MG/ML IJ SOLN
30.0000 mg | Freq: Four times a day (QID) | INTRAMUSCULAR | Status: AC
  Administered 2024-01-05: 30 mg via INTRAVENOUS
  Filled 2024-01-05 (×4): qty 1

## 2024-01-05 MED ORDER — BUPIVACAINE HCL (PF) 0.25 % IJ SOLN
INTRAMUSCULAR | Status: AC
Start: 2024-01-05 — End: 2024-01-05
  Filled 2024-01-05: qty 60

## 2024-01-05 MED ORDER — KETOROLAC TROMETHAMINE 30 MG/ML IJ SOLN
30.0000 mg | Freq: Four times a day (QID) | INTRAMUSCULAR | Status: AC | PRN
  Administered 2024-01-05 – 2024-01-06 (×2): 30 mg via INTRAMUSCULAR

## 2024-01-05 MED ORDER — NALOXONE HCL 4 MG/10ML IJ SOLN: 1.0000 ug/kg/h | INTRAVENOUS | Status: DC | PRN

## 2024-01-05 MED ORDER — MENTHOL 3 MG MT LOZG: 1.0000 | LOZENGE | OROMUCOSAL | Status: DC | PRN

## 2024-01-05 MED ORDER — DIBUCAINE (PERIANAL) 1 % EX OINT: 1.0000 | TOPICAL_OINTMENT | CUTANEOUS | Status: DC | PRN

## 2024-01-05 MED ORDER — DEXAMETHASONE SODIUM PHOSPHATE 10 MG/ML IJ SOLN
INTRAMUSCULAR | Status: DC | PRN
  Administered 2024-01-05: 10 mg via INTRAVENOUS

## 2024-01-05 MED ORDER — MIDAZOLAM HCL 2 MG/2ML IJ SOLN
INTRAMUSCULAR | Status: AC
  Filled 2024-01-05: qty 2

## 2024-01-05 MED ORDER — CEFAZOLIN SODIUM-DEXTROSE 2-3 GM-%(50ML) IV SOLR
INTRAVENOUS | Status: DC | PRN
  Administered 2024-01-05: 2 g via INTRAVENOUS

## 2024-01-05 MED ORDER — LIDOCAINE HCL (PF) 1 % IJ SOLN
INTRAMUSCULAR | Status: DC | PRN
  Administered 2024-01-05: 3 mL via SUBCUTANEOUS

## 2024-01-05 MED ORDER — MEASLES, MUMPS & RUBELLA VAC IJ SOLR
0.5000 mL | Freq: Once | INTRAMUSCULAR | Status: DC
  Filled 2024-01-05: qty 0.5

## 2024-01-05 MED ORDER — PHENYLEPHRINE 80 MCG/ML (10ML) SYRINGE FOR IV PUSH (FOR BLOOD PRESSURE SUPPORT)
PREFILLED_SYRINGE | INTRAVENOUS | Status: DC | PRN
  Administered 2024-01-05 (×5): 160 ug via INTRAVENOUS

## 2024-01-05 MED ORDER — MORPHINE SULFATE (PF) 0.5 MG/ML IJ SOLN
INTRAMUSCULAR | Status: DC | PRN
  Administered 2024-01-05: 150 ug via INTRATHECAL

## 2024-01-05 MED ORDER — ACETAMINOPHEN 500 MG PO TABS
1000.0000 mg | ORAL_TABLET | Freq: Four times a day (QID) | ORAL | Status: AC
  Administered 2024-01-05 – 2024-01-06 (×4): 1000 mg via ORAL
  Filled 2024-01-05 (×4): qty 2

## 2024-01-05 MED ORDER — DEXMEDETOMIDINE HCL IN NACL 80 MCG/20ML IV SOLN
INTRAVENOUS | Status: DC | PRN
  Administered 2024-01-05: 8 ug via INTRAVENOUS
  Administered 2024-01-05: 12 ug via INTRAVENOUS

## 2024-01-05 MED ORDER — CEFAZOLIN SODIUM-DEXTROSE 2-4 GM/100ML-% IV SOLN
INTRAVENOUS | Status: AC
Start: 2024-01-05 — End: 2024-01-05
  Filled 2024-01-05: qty 100

## 2024-01-05 MED ORDER — SODIUM CHLORIDE 0.9 % IV SOLN
INTRAVENOUS | Status: AC
  Filled 2024-01-05: qty 5

## 2024-01-05 MED ORDER — SIMETHICONE 80 MG PO CHEW
80.0000 mg | CHEWABLE_TABLET | Freq: Three times a day (TID) | ORAL | Status: DC
  Administered 2024-01-05 – 2024-01-08 (×11): 80 mg via ORAL
  Filled 2024-01-05 (×12): qty 1

## 2024-01-05 MED ORDER — NALOXONE HCL 0.4 MG/ML IJ SOLN: 0.4000 mg | INTRAMUSCULAR | Status: DC | PRN

## 2024-01-05 MED ORDER — GABAPENTIN 300 MG PO CAPS
300.0000 mg | ORAL_CAPSULE | Freq: Every day | ORAL | Status: DC
  Administered 2024-01-05 – 2024-01-08 (×4): 300 mg via ORAL
  Filled 2024-01-05 (×4): qty 1

## 2024-01-05 MED ORDER — BUPIVACAINE HCL (PF) 0.25 % IJ SOLN
INTRAMUSCULAR | Status: DC | PRN
  Administered 2024-01-05: 60 mL

## 2024-01-05 MED ORDER — MIDAZOLAM HCL 2 MG/2ML IJ SOLN
INTRAMUSCULAR | Status: DC | PRN
  Administered 2024-01-05: 2 mg via INTRAVENOUS

## 2024-01-05 MED ORDER — ONDANSETRON HCL 4 MG/2ML IJ SOLN: 4.0000 mg | Freq: Three times a day (TID) | INTRAMUSCULAR | Status: DC | PRN

## 2024-01-05 MED ORDER — BISACODYL 10 MG RE SUPP: 10.0000 mg | Freq: Every day | RECTAL | Status: DC | PRN

## 2024-01-05 MED ORDER — SIMETHICONE 80 MG PO CHEW
80.0000 mg | CHEWABLE_TABLET | ORAL | Status: DC | PRN
  Administered 2024-01-05: 80 mg via ORAL
  Filled 2024-01-05: qty 1

## 2024-01-05 MED ORDER — KETOROLAC TROMETHAMINE 30 MG/ML IJ SOLN
30.0000 mg | Freq: Four times a day (QID) | INTRAMUSCULAR | Status: AC | PRN
  Administered 2024-01-05: 30 mg via INTRAVENOUS

## 2024-01-05 MED ORDER — SCOPOLAMINE 1 MG/3DAYS TD PT72
1.0000 | MEDICATED_PATCH | Freq: Once | TRANSDERMAL | Status: AC
  Administered 2024-01-05: 1.5 mg via TRANSDERMAL
  Filled 2024-01-05: qty 1

## 2024-01-05 MED ORDER — DIPHENHYDRAMINE HCL 25 MG PO CAPS: 25.0000 mg | ORAL_CAPSULE | Freq: Four times a day (QID) | ORAL | Status: DC | PRN

## 2024-01-05 SURGICAL SUPPLY — 24 items
BARRIER ADHS 3X4 INTERCEED (GAUZE/BANDAGES/DRESSINGS) IMPLANT
CHLORAPREP W/TINT 26 (MISCELLANEOUS) ×1 IMPLANT
DRSG TELFA 3X8 NADH STRL (GAUZE/BANDAGES/DRESSINGS) ×1 IMPLANT
ELECTRODE REM PT RTRN 9FT ADLT (ELECTROSURGICAL) ×1 IMPLANT
GAUZE SPONGE 4X4 12PLY STRL (GAUZE/BANDAGES/DRESSINGS) ×1 IMPLANT
GOWN STRL REUS W/ TWL LRG LVL3 (GOWN DISPOSABLE) ×3 IMPLANT
MANIFOLD NEPTUNE II (INSTRUMENTS) ×1 IMPLANT
MAT PREVALON FULL STRYKER (MISCELLANEOUS) ×1 IMPLANT
NDL HYPO 25GX1X1/2 BEV (NEEDLE) ×1 IMPLANT
NEEDLE HYPO 25GX1X1/2 BEV (NEEDLE) ×1 IMPLANT
NS IRRIG 1000ML POUR BTL (IV SOLUTION) ×1 IMPLANT
PACK C SECTION AR (MISCELLANEOUS) ×1 IMPLANT
PAD OB MATERNITY 11 LF (PERSONAL CARE ITEMS) ×1 IMPLANT
PAD PREP OB/GYN DISP 24X41 (PERSONAL CARE ITEMS) ×1 IMPLANT
SCRUB CHG 4% DYNA-HEX 4OZ (MISCELLANEOUS) ×1 IMPLANT
STAPLER INSORB 30 2030 C-SECTI (MISCELLANEOUS) IMPLANT
SUT VIC AB 0 CT1 36 (SUTURE) ×2 IMPLANT
SUT VIC AB 0 CTX36XBRD ANBCTRL (SUTURE) ×2 IMPLANT
SUT VIC AB 2-0 SH 27XBRD (SUTURE) ×2 IMPLANT
SUTURE MNCRL 4-0 27XMF (SUTURE) ×1 IMPLANT
SYR 30ML LL (SYRINGE) ×2 IMPLANT
TAPE TRANSPORE STRL 2 31045 (GAUZE/BANDAGES/DRESSINGS) IMPLANT
TRAP FLUID SMOKE EVACUATOR (MISCELLANEOUS) ×1 IMPLANT
WATER STERILE IRR 500ML POUR (IV SOLUTION) ×1 IMPLANT

## 2024-01-05 NOTE — Lactation Note (Signed)
 This note was copied from a baby'Parker chart. Lactation Consultation Note  Patient Name: Theresa Parker [id number] Date: 01/05/2024 Age:34 hours Reason for consult: Initial assessment;Late-preterm 34-36.6wks;Infant < 5lbs;NICU baby   Maternal Data Does the patient have breastfeeding experience prior to this delivery?: Yes  Initial assessment w/ a 6hr old postpartum P3 patient.  Infant is currenting in SCN.  This was a c-section delivery.  Patient w/ a hx of A1GDM, anxiety, depression, MJ use, tobacco use, and Hepatitis C.   Patient verbalized that her feeding goal is to breastfeed or pump.  She stated that she has gone to the SCN and put infant to the breast for a feeding.   Feeding [name] Current Feeding Choice: Breast Milk  Lactation Tools Discussed/Used Tools: Pump Breast pump type: Double-Electric Breast Pump Pump Education: Setup, frequency, and cleaning Reason for Pumping: Infant in the NICU Pumping frequency: q3 or 8-12x w/in a 24hr period  Reviewed how to use the DEBP.   Interventions Interventions: Breast feeding basics reviewed;Education;DEBP  LC provided education on the following;  milk production expectations, hunger cues, day 1/2 wet/dirty diapers, hand expression, cluster feeding, benefits of STS and arousing infant for a feeding.  Lactation informed patient of feeding infant at least 8 or more times w/in a 24hr period but not exceeding 3hrs. Patient verbalized understanding.   LC discussed the importance of pumping at least 8x or more w/in a 24hr period.  Patient verbalized that she would like to pump every 2hrs to get her milk supply up.    Discharge Pump: Personal;Hands Toy [place];Manual;DEBP  Consult Status Consult Status: Follow-up Follow-up type: In-patient    Theresa Parker Theresa Parker 01/05/2024, 1:14 PM

## 2024-01-05 NOTE — Progress Notes (Signed)
 Patient insisted on leaving floor to walk outside with her visitors.  Provider Jazmine Liboon notified and aware of IV being removed due to patient going off the floor.  Explained to patient that leaving the floor isn't recommended and that she just had surgery this morning.  Patient has been ambulating on the floor and walking to SCN to visit newborn without any issues.  IV pain medication cannot be given and IV may have to be restarted if lab values indicate there is a need to do so after morning labs are drawn.  Patient is in agreement with this.

## 2024-01-05 NOTE — Progress Notes (Addendum)
 L&D Note    Subjective:  Patient breathing through contractions well. Currently in hands-and-knees position.   Objective:   Vitals:   01/04/24 2300 01/05/24 0100 01/05/24 0342 01/05/24 0629  BP:   102/60 111/62  Pulse:   71 78  Temp: 98.8 F (37.1 C) 98.7 F (37.1 C) 98.8 F (37.1 C) 98.2 F (36.8 C)  Resp:    16  SpO2:    99%  TempSrc: Oral Oral Oral Temporal    Gen: alert, cooperative, no distress FHR: Baseline: 140 bpm, Variability: moderate, Accels: Present, Decels: prolong deceleration approximately @ 0400 Toco: regular, every 1-3 minutes SVE: Dilation: 3 Effacement (%): 60 Station: -1 Exam by:: katie A RN  Medications SCHEDULED MEDICATIONS   acetaminophen   1,000 mg Oral Q6H   [START ON 01/06/2024] acetaminophen   1,000 mg Oral Q6H   ammonia       bupivacaine (PF)       escitalopram  10 mg Oral Daily   ferrous sulfate  325 mg Oral BID WC   gabapentin  300 mg Oral QHS   ketorolac  30 mg Intravenous Q6H   Followed by   Cecily Cohen ON 01/06/2024] ibuprofen  600 mg Oral Q6H   lidocaine  (PF)       [START ON 01/06/2024] measles, mumps & rubella vaccine  0.5 mL Subcutaneous Once   misoprostol       oxytocin       prenatal multivitamin  1 tablet Oral Q1200   scopolamine  1 patch Transdermal Once   senna-docusate  2 tablet Oral Q24H   simethicone  80 mg Oral TID PC   sodium citrate-citric acid       [START ON 01/06/2024] Tdap  0.5 mL Intramuscular Once   traZODone  50 mg Oral QHS    MEDICATION INFUSIONS   ceFAZolin     lactated ringers     naloxone HCl (NARCAN) 2 mg in dextrose 5 % 250 mL infusion     oxytocin      PRN MEDICATIONS  ammonia, bisacodyl, bupivacaine (PF), ceFAZolin, coconut oil, witch hazel-glycerin **AND** dibucaine, diphenhydrAMINE **OR** diphenhydrAMINE, diphenhydrAMINE, ketorolac **OR** ketorolac, lidocaine  (PF), menthol-cetylpyridinium, misoprostol, naloxone **AND** sodium chloride flush, naloxone HCl (NARCAN) 2 mg in dextrose 5 % 250 mL infusion,  ondansetron (ZOFRAN) IV, oxyCODONE, oxytocin, simethicone, sodium citrate-citric acid, sodium phosphate   Assessment & Plan:  33 y.o. [id number] at [redacted]w[redacted]d admitted for PPROM at [redacted]w[redacted]d.  -Labor: Patient opted for immediate delivery versus expectant management.  IV Pitocin infusing @ 4. CNM called to bedside for prolonged deceleration. CNM arrived to room promptly and observed patient in hands-and-knees position. FHR observed to be 95 bmp. IV Pitocin stopped upon CNM arrival to bedside and LR bolus currently infusing. Patient changed position to right-side lying for cervical exam. SVE: 3/60/-1 (unchanged). Placement of IUPC and FSE briefly discussed with patient. Patient gave consent of FSE and IUPC placement and FSE and IUPC were placed promptly. Alvia Awkward, MD notified of patient status approximately around 0406 and arrived in-house promptly. Prolonged deceleration observed to last for approximately 11-13 minutes. FHR then noted to be tachycardic (increasing to 210 and >160 bpm for approximately 30 minutes). Terbutaline given to patient approximately around 0426. Cesarean birth discussed with patient and partner by CNM and Alvia Awkward, MD. Benefits/risks discussed and all questions answered. Patient and partner agreed to proceed with a cesarean birth due to fetal status and had no further questions.   -Fetal Well-being: Category II -GBS: unknown; GBS pending  - Received  penicillin  x 1  -Membranes: SROM approximately @ 1730 -Proceed with Cesarean birth due to fetal intolerance to labor  -Analgesia: Spinal analgesia    Madolin Twaddle, CNM  01/05/2024 6:31 AM  [facility]

## 2024-01-05 NOTE — Consult Note (Signed)
 Consultation Service: Neonatology   The obstetric team has asked for consultation for Theresa Parker and FOB regarding the care of her premature daughter "Theresa Parker". Thank you for inviting us  to see this patient.   Theresa Parker presented to L&D late on 01/04/2024 with premature ROM and contractions every 1 to 7 minutes. She was admitted for the expectant management and delivery of her infant at 38 1/7 weeks, with a planned vaginal delivery if possible.  I have reviewed the patient's chart and have met with her.  Mother is on latency antibiotics and continuous monitoring.    Prenatal labs: Blood type/Rh A Positive   Antibody screen Negative    Rubella Immune    Varicella Immune  RPR Non Reactive    HBsAg Negative   Hep C Non Reactive   HIV Negative    GC Pending   Chlamydia Pending   Genetic screening cfDNA negative   1 hour GTT 198  3 hour GTT N/A  GBS Pending        Prenatal care:    Limited- Late entry to care at 19 weeks, lapse in care from 23-32 weeks  Pregnancy complications:   Gestational DM (diet controlled), anxiety and depression on lexapro, marijuana and tobacco use during pregnancy, chronic hepatitis C, premature ROM at 34 weeks (5/17 at 1530 per patient report, Nitrazine + on admission,) cystic fibrosis carrier, hx of preterm deliver at 36 weeks for G1 and G3 Maternal antibiotics: PCN G for GBS prophylaxis (GBS status pending) Maternal Steroids: Plan for 2 doses prior to delivery, first dose 2308 5/17 Maternal Diabetes: Yes:  Diabetes Type:  Diet controlled Genetic Screening: Normal Maternal Ultrasounds/Referrals: Normal Fetal Ultrasounds or other Referrals:  None Maternal Substance Abuse:  Yes:  Type: Marijuana Significant Maternal Medications:  Meds include: Lexapro 10 mg  Significant Maternal Lab Results: GBS: Pending  Social History: Mother reports that she has been smoking cigarettes and e-cigarettes. She has a 21 pack-year smoking history. She has never used  smokeless tobacco, reports that she does not currently use alcohol. She reports that she does not currently use drugs after having used the following drugs: "Crack" cocaine, Heroin, and Marijuana.     In the presence of Theresa Parker and a friend (FOB sleeping in room,) I spent 20 minutes discussing the possible complications and outcomes of premature infants born at [redacted] weeks gestation. We discussed the possible need for resuscitation at birth due to respiratory distress which may require mechanical ventilation, CPAP, and surfactant administration. We also discussed the need for IV fluid support until enteral feedings are well established (encouraged breast milk feeding), antibiotics for possible sepsis, temperature support, and continuous monitoring. Mother expressed her understanding of the risks and complications of prematurity.  We discussed the favorable prognosis for babies born at 22 weeks, though Theresa Parker may require a prolonged inpatient stay (2-6 weeks,) for possible respiratory distress, thermoregulation and establishment of PO enteral feedings and growth management throughout SCN stay, mother expressed an understanding of this information.   I informed her the SCN team would be present at the delivery, she also understands that our team will always be available for any questions that may come up during Kinley's hospitalization and we will continue to partner with her and her family to support them. Visitation policy was discussed and all questions were addressed.    33 yo [id number] female with a 34 1/7 week IUP with preterm labor, premature ROM with expressed understanding of the possible complications and prognosis for  her daughter "Theresa Parker." The mother understands the plan for resuscitation and SCN care, all questions were answered. She is planning to provide breast milk for her infant.    ______________________________________________________________________  Thank you for asking us  to  participate in the care of this patient. Please do not hesitate to contact us  again if we can be of further assistance.   Alyse July, NNP-[facility] Neonatal Nurse Practitioner   I spent 35 minutes in consultation time, 20 minutes were spent in direct face to face counseling.

## 2024-01-05 NOTE — Lactation Note (Signed)
 This note was copied from a baby's chart. Lactation Consultation Note  Patient Name: Theresa Parker [id number] Date: 01/05/2024 Age:34 hours Reason for consult: Follow-up assessment;Late-preterm 34-36.6wks;Infant < 5lbs;NICU baby   Maternal Data Lactation to room follow up with a pump session with mom.  Patient putting together the parts of the pump to get started pumping.  Patient getting real antsy about the amt of milk coming out and stating it wasn't pulling enough.  Feeding Mother's Current Feeding Choice: Breast Milk  Lactation Tools Discussed/Used Tools: Pump Breast pump type: Double-Electric Breast Pump  Patient is using a a size 21mm flanges.  Milk started coming out of both breast during the pumping session.  LC informed mom she has to be patient.  Education was provided on what happens within the first couple of days. Patient verbalized understanding.   Interventions Interventions: Education  Consult Status Consult Status: Follow-up Follow-up type: In-patient    [name] [name] 01/05/2024, 3:45 PM

## 2024-01-05 NOTE — Progress Notes (Signed)
 [id number] at [redacted]w[redacted]d, with PPROM. On latency abx. Called to the bedside for precipitous fetal bradycardia to the 90s, not resolved with maternal positioning. Pitocin on 4 stopped, fluid bolus, terb given. IUPC placed. Recovery started after 11 minutes <90 bpm to recovery tachycardia to 210 and >160 for 30 minutes. I was called and arrived at the bedside during the end of this tachycardia. Patient requesting cesarean section after discussing options, including close monitoring and expectant management. Family at the bedside.  The risks of cesarean section discussed with the patient included but were not limited to: bleeding which may require transfusion or reoperation; infection which may require antibiotics; injury to bowel, bladder, ureters or other surrounding organs; injury to the fetus; need for additional procedures including hysterectomy in the event of a life-threatening hemorrhage; placental abnormalities wth subsequent pregnancies, incisional problems, thromboembolic phenomenon and other postoperative/anesthesia complications. The patient concurred with the proposed plan, giving informed written consent for the procedure. Anesthesia and OR aware. Preoperative prophylactic antibiotics and SCDs ordered on call to the OR.  To OR when ready.  SCN team at bedside, s/p neonatal consult previously.

## 2024-01-05 NOTE — Consult Note (Incomplete)
 Neonatology Consult  Note:  At the request of the patients obstetrician Dr.     I met with    who is at   wks currently with preg complicated by     We reviewed initial delivery room management, including CPAP, [place], and low but certainly possible need for intubation for surfactant administration.  We discussed feeding immaturity and need for full po intake with multiple days of good weight gain and no apnea or bradycardia before discharge.  We reviewed increased risk of jaundice, infection, and temperature instability.   Discussed likely length of stay.  Thank you for allowing us  to participate in her care.  Please call with questions. Brad Cables NNP-BC  Neonatal Nurse Practitioner   The total length of face-to-face or floor / unit time for this encounter was 30 minutes.  Counseling and / or coordination of care was greater than fifty percent of the time.

## 2024-01-05 NOTE — Progress Notes (Signed)
 Patient ambulated back to room 351 AA.

## 2024-01-05 NOTE — Progress Notes (Signed)
 Patient ambulated outside to smoke per MD agreement.

## 2024-01-05 NOTE — Anesthesia Preprocedure Evaluation (Signed)
 Anesthesia Evaluation  Patient identified by MRN, date of birth, ID band Patient awake    Reviewed: Allergy & Precautions, NPO status , Patient's Chart, lab work & pertinent test results  Airway Mallampati: III  TM Distance: >3 FB Neck ROM: full    Dental  (+) Chipped   Pulmonary neg pulmonary ROS, Current Smoker   Pulmonary exam normal        Cardiovascular Exercise Tolerance: Good negative cardio ROS Normal cardiovascular exam     Neuro/Psych  PSYCHIATRIC DISORDERS Anxiety Depression       GI/Hepatic negative GI ROS,,,(+) Hepatitis -, C  Endo/Other    Renal/GU   negative genitourinary   Musculoskeletal   Abdominal   Peds  Hematology negative hematology ROS (+)   Anesthesia Other Findings Past Medical History: No date: History of dizziness No date: History of hepatitis C No date: History of intravenous drug use in remission No date: Migraine No date: Nausea No date: Vaginal discharge  Past Surgical History: No date: APPENDECTOMY No date: BUNIONECTOMY     Reproductive/Obstetrics (+) Pregnancy                             Anesthesia Physical Anesthesia Plan  ASA: 2  Anesthesia Plan: Spinal   Post-op Pain Management:    Induction:   PONV Risk Score and Plan:   Airway Management Planned: Natural Airway and Nasal Cannula  Additional Equipment:   Intra-op Plan:   Post-operative Plan:   Informed Consent: I have reviewed the patients History and Physical, chart, labs and discussed the procedure including the risks, benefits and alternatives for the proposed anesthesia with the patient or authorized representative who has indicated his/her understanding and acceptance.     Dental Advisory Given  Plan Discussed with: Anesthesiologist, CRNA and Surgeon  Anesthesia Plan Comments: (Patient reports no bleeding problems and no anticoagulant use.  Plan for spinal with  backup GA  Patient consented for risks of anesthesia including but not limited to:  - adverse reactions to medications - damage to eyes, teeth, lips or other oral mucosa - nerve damage due to positioning  - risk of bleeding, infection and or nerve damage from spinal that could lead to paralysis - risk of headache or failed spinal - damage to teeth, lips or other oral mucosa - sore throat or hoarseness - damage to heart, brain, nerves, lungs, other parts of body or loss of life  Patient voiced understanding and assent.)       Anesthesia Quick Evaluation

## 2024-01-05 NOTE — Anesthesia Procedure Notes (Signed)
 Spinal  Patient location during procedure: OB Start time: 01/05/2024 5:10 AM End time: 01/05/2024 5:18 AM Reason for block: surgical anesthesia Staffing Performed: resident/CRNA  Anesthesiologist: Enrique Harvest, MD Resident/CRNA: Bobette Burrs, CRNA Performed by: Bobette Burrs, CRNA Authorized by: Enrique Harvest, MD   Preanesthetic Checklist Completed: patient identified, IV checked, site marked, risks and benefits discussed, surgical consent, monitors and equipment checked, pre-op evaluation and timeout performed Spinal Block Patient position: sitting Prep: ChloraPrep Patient monitoring: heart rate, continuous pulse ox and blood pressure Approach: midline Location: L3-4 Injection technique: single-shot Needle Needle type: Pencan  Needle gauge: 25 G Needle length: 9 cm Assessment Sensory level: T4 Events: CSF return Additional Notes IV functioning, monitors applied to pt. Expiration date of kit checked and confirmed to be in date. Sterile prep and drape, hand hygiene and sterile gloved used. Pt was positioned and spine was prepped in sterile fashion. Skin was anesthetized with lidocaine . Free flow of clear CSF obtained prior to injecting local anesthetic into CSF x 1 attempt. Spinal needle aspirated freely following injection. Needle was carefully withdrawn, and pt tolerated procedure well. Loss of motor and sensory on exam post injection.

## 2024-01-05 NOTE — Transfer of Care (Signed)
 Immediate Anesthesia Transfer of Care Note  Patient: Theresa Parker  Procedure(s) Performed: CESAREAN DELIVERY  Patient Location: Mother/Baby  Anesthesia Type:Spinal  Level of Consciousness: awake, alert , and oriented  Airway & Oxygen Therapy: Patient Spontanous Breathing  Post-op Assessment: Report given to RN and Post -op Vital signs reviewed and stable  Post vital signs: Reviewed and stable  Last Vitals:  Vitals Value Taken Time  BP 111/62 01/05/24 0629  Temp 36.8 C 01/05/24 0629  Pulse 66 01/05/24 0629  Resp 16 01/05/24 0629  SpO2 99 % 01/05/24 0629  Vitals shown include unfiled device data.  Last Pain:  Vitals:   01/05/24 0629  TempSrc: Temporal  PainSc: 0-No pain         Complications: No notable events documented.

## 2024-01-05 NOTE — Discharge Summary (Signed)
 Obstetrical Discharge Summary  Patient Name: Theresa Parker DOB: 08-16-1991 MRN: [id number]  Date of Admission: 01/04/2024 Date of Delivery: 01/05/24 Delivered by: Prescilla Brod, MD MPH Date of Discharge: 01/08/2024  [facility] [id number] last menstrual period was 05/11/2023 (exact date). EDC Estimated Date of Delivery: 02/15/24 Gestational Age at Delivery: [redacted]w[redacted]d   Antepartum complications:  A1GDM- diet controlled Anxiety and Depression Substance Use in pregnancy- Marijuana use Tobacco Use in pregnancy Carrier for Cystic Fibrosis  Insomnia Chronic Hepatitis C  Late to prenatal care- NOB @ 19wks Lapse in care- no care between 23 weeks and 32 weeks  H/o PPROM/PTD @ 36 weeks Hx of cocaine, heroine use s/p rehab, only THC through the pregnancy, THC on UDS on admission  Admitting Diagnosis: Leakage of amniotic fluid [O42.90] Preterm premature rupture of membranes in third trimester [O42.913]  Secondary Diagnosis: 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   Gestational diabetes mellitus (GDM), antepartum 12/17/2023   History of preterm premature rupture of membranes (PPROM) 12/17/2023   Chest pain varying with breathing 12/08/2023   Supervision of high risk pregnancy in third trimester 07/30/2023   PTSD (post-traumatic stress disorder) 05/01/2022   Abnormal Pap smear of cervix 02/03/20 02/09/2020   Smoker 1-2 ppd 02/03/2020   History of crack cocaine use ages [age] with rehab x5 02/03/2020   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 02/03/2020   Lost custody of daughter 02/03/2020   Self-mutilation cutter age 40; choked self age 51 02/03/2020   Anorexia since childhood 02/03/2020   Generalized anxiety disorder 03/20/2017   Major depressive disorder, recurrent episode, 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 Decision made for primary cesarean section. Please see operative note for more details.  Delivery Type: primary cesarean section, low transverse incision Anesthesia: spinal anesthesia Placenta: spontaneous and manual removal To Pathology: No  Laceration: n/a Episiotomy: none Newborn Data: Live born female "Theresa Parker" Birth Weight:  1920g APGAR: 9/8 to SCN  Newborn Delivery   Birth date/time: 01/05/2024 05:36:00 Delivery type: C-Section, Low Transverse Trial of labor: Yes C-section categorization: Primary     Postpartum Procedures: none Edinburgh:     01/05/2024    9:14 PM  Dimple Francis Postnatal Depression Scale Screening Tool  I have been able to laugh and see the funny side of things. 0  I have looked forward with enjoyment to things. 0  I have blamed myself unnecessarily when things went wrong. 1  I have been anxious or worried for no good reason. 0  I have felt scared or panicky for no good reason. 0  Things have been getting on top of me. 0  I have been so unhappy that I have had difficulty sleeping. 0  I have felt sad or  miserable. 0  I have been so unhappy that I have been crying. 1  The thought of harming myself has occurred to me. 0  Edinburgh Postnatal Depression Scale Total 2     Post partum  course: (Cesarean Section):  Patient had an uncomplicated postpartum course.  By time of discharge on POD#3, her pain was controlled on oral pain medications; she had appropriate lochia and was ambulating, voiding without difficulty, tolerating regular diet and passing flatus.   She was deemed stable for discharge to home.    Discharge Physical Exam:   BP 136/83 (BP Location: Left Arm)   Pulse 87   Temp 98.5 F (36.9 C) (Oral)   Resp 17   Ht 5\' 1"  (1.549 m)   Wt 67.1 kg   LMP 05/11/2023 (Exact Date)   SpO2 100%   Breastfeeding Unknown   BMI 27.96 kg/m   General: NAD CV: RRR Pulm: CTABL, nl effort ABD: s/nd/nt, fundus firm and below the umbilicus Lochia: moderate Perineum: minimal edema/intact Incision: c/d/I, covered with occlusive OP site dressing   DVT Evaluation: LE non-ttp, no evidence of DVT on exam.  Hemoglobin  Date Value Ref Range Status  01/07/2024 8.4 (L) 12.0 - 15.0 g/dL Final  16/05/9603 54.0 11.1 - 15.9 g/dL Final   HCT  Date Value Ref Range Status  01/07/2024 24.7 (L) 36.0 - 46.0 % Final   Hematocrit  Date Value Ref Range Status  05/15/2022 38.7 34.0 - 46.6 % Final    Risk assessment for postpartum VTE and prophylactic treatment: Very high risk factors: None High risk factors: Unscheduled cesarean after labor  Moderate risk factors: Cesarean delivery   Postpartum VTE prophylaxis with LMWH not indicated  Disposition: stable, discharge to home. Baby Feeding: breast and formula feeding Baby Disposition: home with mom  Rh Immune globulin indicated: No Rubella vaccine given: was not indicated Varivax vaccine given: was not indicated Flu vaccine given in AP setting: No Tdap vaccine given in AP setting: Yes   Contraception: undecided  Prenatal Labs:  Blood type/Rh A Positive   Antibody screen Negative    Rubella Immune    Varicella Immune  RPR Non Reactive    HBsAg Negative   Hep C Non Reactive   HIV Negative    GC Pending    Chlamydia Pending   Genetic screening cfDNA negative   1 hour GTT 198 3 hour GTT N/A  GBS Pending      (copy from H&P)  Plan:  Skylor Hughson was discharged to home in good condition. Follow-up appointment with delivering provider in 2 weeks for incision check.  Discharge Medications: Allergies as of 01/08/2024       Reactions   Latex Other (See Comments)   Latex condoms cause yeast infections / vaginal irritation        Medication List     STOP taking these medications    busPIRone 5 MG tablet Commonly known as: BUSPAR   doxycycline  100 MG capsule Commonly known as: MONODOX    hydrOXYzine  25 MG tablet Commonly known as: ATARAX    mirtazapine 7.5 MG tablet Commonly known as: REMERON   nicotine 7 mg/24hr patch Commonly known as: NICODERM CQ - dosed in mg/24 hr   venlafaxine XR 75 MG 24 hr capsule Commonly known as: EFFEXOR-XR       TAKE these medications    acetaminophen  500 MG tablet Commonly known as: TYLENOL  Take 2 tablets (1,000 mg total) by mouth every 6 (six) hours as needed for fever  or mild pain (pain score 1-3).   escitalopram  10 MG tablet Commonly known as: LEXAPRO  Take 2 tablets (20 mg total) by mouth daily. What changed: how much to take   ferrous sulfate  325 (65 FE) MG tablet Take 1 tablet (325 mg total) by mouth 2 (two) times daily with a meal.   ibuprofen  600 MG tablet Commonly known as: ADVIL  Take 1 tablet (600 mg total) by mouth every 6 (six) hours as needed for cramping, mild pain (pain score 1-3) or fever.   lidocaine  5 % Commonly known as: Lidoderm  Place 1 patch onto the skin every 12 (twelve) hours. Remove & Discard patch within 12 hours or as directed by MD   M-Natal Plus 27-1 MG Tabs Take 1 tablet by mouth daily.   oxyCODONE  5 MG immediate release tablet Commonly known as: Oxy IR/ROXICODONE  Take 1-2 tablets (5-10 mg total) by mouth every 6 (six) hours as needed for up to 7 days for severe pain (pain score  7-10).   promethazine 25 MG tablet Commonly known as: PHENERGAN Take 25 mg by mouth every 6 (six) hours as needed.   senna-docusate 8.6-50 MG tablet Commonly known as: Senokot-S Take 2 tablets by mouth at bedtime as needed for mild constipation.   simethicone  80 MG chewable tablet Commonly known as: MYLICON Chew 1 tablet (80 mg total) by mouth 4 (four) times daily as needed for flatulence.   traZODone  50 MG tablet Commonly known as: DESYREL  Take 1 tablet by mouth at bedtime. What changed: Another medication with the same name was removed. Continue taking this medication, and follow the directions you see here.         Follow-up Information     Prescilla Brod, MD Follow up in 2 week(s).   Specialty: Obstetrics and Gynecology Why: For postop check Contact information: [address] [place] [address] [phone]                 Signed:  Auston Left, CNM 01/08/2024 2:26 PM

## 2024-01-05 NOTE — Op Note (Signed)
 Cesarean Section Procedure Note  Date of procedure: 01/05/2024   Pre-operative Diagnosis: Intrauterine pregnancy at [redacted]w[redacted]d;  - PPROM - Fetal intolerance to labor  Post-operative Diagnosis: same, delivered.  Procedure: Primary Low Transverse Cesarean Section through Pfannenstiel incision  Surgeon: Prescilla Brod, MD  Assistant(s):  Jazmine Laboon, CNM  An experienced assistant was required given the standard of surgical care given the complexity of the case.  This assistant was needed for exposure, dissection, suctioning, retraction, instrument exchange,  CNM assisting with delivery with administration of fundal pressure, and for overall help during the procedure.   Anesthesia: Spinal anesthesia  Anesthesiologist: Enrique Harvest, MD Anesthesiologist: Enrique Harvest, MD CRNA: Argie Kung D, CRNA  Estimated Blood Loss:          Drains: Foley         Total IV Fluids:  Urine Output:         Specimens: Cord gases         Complications:  None; patient tolerated the procedure well.         Disposition: PACU - hemodynamically stable.         Condition: stable  Findings:  A female infant in cephalic presentation. Amniotic fluid - Clear  Birth weight 1920 g.   APGAR (1 MIN):  9 APGAR (5 MINS):  8 APGAR (10 MINS):     Intact placenta with a three-vessel cord.  Grossly normal uterus, tubes and ovaries bilaterally.   Indications: non-reassuring fetal status  [id number] at [redacted]w[redacted]d, with PPROM. On latency abx. Called to the bedside for precipitous fetal bradycardia to the 90s, not resolved with maternal positioning. Pitocin on 4 stopped, fluid bolus, terb given. IUPC placed. FSE placed. Recovery started after 11 minutes <90 bpm to recovery tachycardia to 210 and >160 for 30 minutes. I was called and arrived at the bedside during the end of this tachycardia. Patient requesting cesarean section after discussing options, including close monitoring and expectant  management. Family at the bedside.   Procedure Details  The patient was taken to Operating Room, identified as the correct patient and the procedure verified as C-Section Delivery. A formal Time Out was held with all team members present and in agreement.  After induction of anesthesia, the patient was draped and prepped in the usual sterile manner. A Pfannenstiel skin incision was made and carried down through the subcutaneous tissue to the fascia. Fascial incision was made and extended transversely with the Mayo scissors. The fascia was separated from the underlying rectus tissue superiorly and inferiorly. The peritoneum was identified and entered bluntly. Peritoneal incision was extended longitudinally. The utero-vesical peritoneal reflection was incised transversely and a bladder flap was created digitally.   A low transverse hysterotomy was made. The fetus was delivered atraumatically. The umbilical cord was clamped x2 and cut and the infant was handed to the awaiting pediatricians. The placenta was removed intact and appeared normal, intact, and with a 3-vessel cord.   The uterus was exteriorized and cleared of all clot and debris. The hysterotomy was closed with running sutures of 0-Vicryl. A second imbricating layer was placed with the same suture. Excellent hemostasis was observed. The peritoneal cavity was cleared of all clots and debris. The uterus was returned to the abdomen.   Gutters and pelvis were evaluated and excellent hemostasis was noted. The fascia was then reapproximated with running sutures of 0 Maxon.  The skin was reapproximated with Ensorb absorbable staples. 30 of 0.5% bupivicaine was placed in the fascial and  skin lines.  Instrument, sponge, and needle counts were correct prior to the abdominal closure and at the conclusion of the case.   The patient tolerated the procedure well and was transferred to the recovery room in stable condition.   Prescilla Brod,  MD 01/05/2024

## 2024-01-06 DIAGNOSIS — F419 Anxiety disorder, unspecified: Secondary | ICD-10-CM | POA: Diagnosis present

## 2024-01-06 DIAGNOSIS — F17200 Nicotine dependence, unspecified, uncomplicated: Secondary | ICD-10-CM | POA: Diagnosis present

## 2024-01-06 DIAGNOSIS — Z87898 Personal history of other specified conditions: Secondary | ICD-10-CM

## 2024-01-06 DIAGNOSIS — O9081 Anemia of the puerperium: Secondary | ICD-10-CM | POA: Diagnosis not present

## 2024-01-06 DIAGNOSIS — F1290 Cannabis use, unspecified, uncomplicated: Secondary | ICD-10-CM | POA: Diagnosis present

## 2024-01-06 LAB — CBC
HCT: 20.9 % — ABNORMAL LOW (ref 36.0–46.0)
HCT: 21 % — ABNORMAL LOW (ref 36.0–46.0)
Hemoglobin: 7.1 g/dL — ABNORMAL LOW (ref 12.0–15.0)
Hemoglobin: 7.3 g/dL — ABNORMAL LOW (ref 12.0–15.0)
MCH: 32.1 pg (ref 26.0–34.0)
MCH: 32.7 pg (ref 26.0–34.0)
MCHC: 34 g/dL (ref 30.0–36.0)
MCHC: 34.8 g/dL (ref 30.0–36.0)
MCV: 94.6 fL (ref 80.0–100.0)
MCV: 94.2 fL (ref 80.0–100.0)
Platelets: 125 10*3/uL — ABNORMAL LOW (ref 150–400)
Platelets: 136 10*3/uL — ABNORMAL LOW (ref 150–400)
RBC: 2.21 MIL/uL — ABNORMAL LOW (ref 3.87–5.11)
RBC: 2.23 MIL/uL — ABNORMAL LOW (ref 3.87–5.11)
RDW: 13.2 % (ref 11.5–15.5)
RDW: 13.2 % (ref 11.5–15.5)
WBC: 18.2 10*3/uL — ABNORMAL HIGH (ref 4.0–10.5)
WBC: 16.3 10*3/uL — ABNORMAL HIGH (ref 4.0–10.5)
nRBC: 0 % (ref 0.0–0.2)
nRBC: 0 % (ref 0.0–0.2)

## 2024-01-06 LAB — RPR: RPR Ser Ql: NONREACTIVE

## 2024-01-06 MED ORDER — EPINEPHRINE 0.3 MG/0.3ML IJ SOAJ: 0.3000 mg | Freq: Once | INTRAMUSCULAR | Status: DC | PRN

## 2024-01-06 MED ORDER — METHYLPREDNISOLONE SODIUM SUCC 125 MG IJ SOLR: 125.0000 mg | Freq: Once | INTRAMUSCULAR | Status: DC | PRN

## 2024-01-06 MED ORDER — SODIUM CHLORIDE 0.9 % IV BOLUS: 500.0000 mL | Freq: Once | INTRAVENOUS | Status: DC | PRN

## 2024-01-06 MED ORDER — ALBUTEROL SULFATE (2.5 MG/3ML) 0.083% IN NEBU: 2.5000 mg | INHALATION_SOLUTION | Freq: Once | RESPIRATORY_TRACT | Status: DC | PRN

## 2024-01-06 MED ORDER — SODIUM CHLORIDE 0.9 % IV SOLN: INTRAVENOUS | Status: AC | PRN

## 2024-01-06 MED ORDER — IRON SUCROSE 500 MG IVPB - SIMPLE MED
500.0000 mg | Freq: Once | INTRAVENOUS | Status: AC
  Administered 2024-01-06: 500 mg via INTRAVENOUS
  Filled 2024-01-06: qty 500

## 2024-01-06 NOTE — Anesthesia Postprocedure Evaluation (Signed)
 Anesthesia Post Note  Patient: Theresa Parker  Procedure(s) Performed: CESAREAN DELIVERY  Patient location during evaluation: Mother Baby Anesthesia Type: Spinal Level of consciousness: oriented and awake and alert Pain management: pain level controlled Vital Signs Assessment: post-procedure vital signs reviewed and stable Respiratory status: spontaneous breathing and respiratory function stable Cardiovascular status: blood pressure returned to baseline and stable Postop Assessment: no headache, no backache, no apparent nausea or vomiting and able to ambulate Anesthetic complications: no   No notable events documented.   Last Vitals:  Vitals:   01/05/24 1954 01/05/24 2259  BP: 105/65 112/79  Pulse: 60 90  Resp: 18 18  Temp: 37.3 C 37.1 C  SpO2: 99% 97%    Last Pain:  Vitals:   01/06/24 0411  TempSrc:   PainSc: Asleep                 Nasario Badder

## 2024-01-06 NOTE — Anesthesia Post-op Follow-up Note (Signed)
  Anesthesia Pain Follow-up Note  Patient: Theresa Parker  Day #: 1  Date of Follow-up: 01/06/2024 Time: 7:46 AM  Last Vitals:  Vitals:   01/05/24 1954 01/05/24 2259  BP: 105/65 112/79  Pulse: 60 90  Resp: 18 18  Temp: 37.3 C 37.1 C  SpO2: 99% 97%    Level of Consciousness: alert  Pain: none   Side Effects:None  Catheter Site Exam:clean, dry, no drainage     Plan: D/C from anesthesia care at surgeon's request  Wolf, Sevilla Murtagh C

## 2024-01-06 NOTE — Progress Notes (Signed)
 Postop Day  1  Subjective: 33 y.o. [id number] postpartum day #1 status post primary cesarean section. She is ambulating, is tolerating po, is voiding spontaneously.  Her pain is well controlled on PO pain medications. Her lochia is less than menses.  She is ambulating to SCN to visit with infant. Also ambulating outside of hospital to smoke cigarettes. States she has her boyfriend meet her at the ED and he drives her to the parking lot to be able to smoke a couple of cigarettes. Denies feeling dizzy, light headed, fatigue, or SOB.   Objective: BP 99/62 (BP Location: Left Arm)   Pulse 67   Temp 98.8 F (37.1 C) (Oral)   Resp 20   Ht 5\' 1"  (1.549 m)   Wt 67.1 kg   LMP 05/11/2023 (Exact Date)   SpO2 100%   Breastfeeding Unknown   BMI 27.96 kg/m    Physical Exam:  General: alert, cooperative, and no distress Breasts: soft/nontender Pulm: nl effort Abdomen: soft, non-tender, active bowel sounds Uterine Fundus: firm Incision: no significant drainage, covered with pressure dressing  Perineum: minimal edema, intact Lochia: appropriate DVT Evaluation: No evidence of DVT seen on physical exam.     Latest Ref Rng & Units 01/06/2024    7:18 AM 01/06/2024    5:57 AM 01/04/2024   10:31 PM  CBC  WBC 4.0 - 10.5 K/uL 16.3  18.2  14.7   Hemoglobin 12.0 - 15.0 g/dL 7.3  7.1  14.7   Hematocrit 36.0 - 46.0 % 21.0  20.9  32.6   Platelets 150 - 400 K/uL 136  125  167      Assessment/Plan: 33 y.o. [id number] postpartum day # 1  1. Continue routine postpartum care -Plan to remove pressure dressing after 24 hours  -Replace with occlusive OP site dressing   2. Infant feeding status: expressed breast milk -Lactation consult PRN for breastfeeding  -Visits infant in SCN   3. Contraception plan: TBD  4. Acute blood loss anemia - clinically significant.  -Hemodynamically stable and asymptomatic -Intervention: continue on oral supplementation with ferrous sulfate  325 and IV iron  transfusion with  venofer  ordered  -Repeat CBC in AM   5. Immunization status:   all immunizations up to date  6. Nicotine dependence  -We discussed that I do not recommend she leave the hospital to smoke  -Offered nicotine patches/gum  -Asta declines nicotine replacement products. She understands the risks of tobacco products and ambulating outside of the hospital with anemia and postoperative day 1. Discussed having her boyfriend use wheel chair instead of ambulating the entire distance.   7. Chronic Hep C -HCV quant ordered  -Previous quant 700,000 -Will refer to GI postpartum for treatment   8. Marijuana use in pregnancy  -UDS positive for Marijuana on admission  -She has a history of substance use disorder but has not used substances like cocain and heroin in over 9 years. States marijuana and nicotine is the only substance she currently uses  -TOC consult placed   Disposition: continue inpatient postpartum care    LOS: 2 days   Teodora Fell, Ivor Mars 01/06/2024, 9:43 AM   ----- Fraser Jackson  Certified Nurse Midwife [facility]

## 2024-01-06 NOTE — Lactation Note (Signed)
 This note was copied from a baby's chart. Lactation Consultation Note  Patient Name: Theresa Parker [id number] Date: 01/06/2024 Age:34 hours     Maternal Data    Feeding    LATCH Score Latch: Repeated attempts needed to sustain latch, nipple held in mouth throughout feeding, stimulation needed to elicit sucking reflex.  Audible Swallowing: A few with stimulation  Type of Nipple: Everted at rest and after stimulation  Comfort (Breast/Nipple): Soft / non-tender  Hold (Positioning): Assistance needed to correctly position infant at breast and maintain latch.  LATCH Score: 7   Lactation Tools Discussed/Used    Interventions  LC to nursery to meet with mother of baby to assist with pumping and education. RN states mom is consistently pumping and does not need assistance at this time.   Discharge    Consult Status      Seldon Dago 01/06/2024, 8:06 PM

## 2024-01-06 NOTE — Progress Notes (Signed)
 Pt came to the nurses' station; pt said "I don't mind getting another needle stick tomorrow, but I need this iv out"; RN explained to the patient that she has labs ordered for the morning and depending on the results, she may need fluids or medicines through an iv; pt says she is ok with 1 and maybe 2 needle sticks tomorrow; another RN heard this conversation too

## 2024-01-07 ENCOUNTER — Encounter: Payer: Self-pay | Admitting: Obstetrics and Gynecology

## 2024-01-07 LAB — HCV RNA QUANT
HCV Quantitative Log: 5.19 {Log_IU}/mL (ref >1.70)
HCV Quantitative: 155000 [IU]/mL (ref >50)

## 2024-01-07 LAB — CBC
HCT: 24.7 % — ABNORMAL LOW (ref 36.0–46.0)
Hemoglobin: 8.4 g/dL — ABNORMAL LOW (ref 12.0–15.0)
MCH: 32.6 pg (ref 26.0–34.0)
MCHC: 34 g/dL (ref 30.0–36.0)
MCV: 95.7 fL (ref 80.0–100.0)
Platelets: 191 10*3/uL (ref 150–400)
RBC: 2.58 MIL/uL — ABNORMAL LOW (ref 3.87–5.11)
RDW: 13.4 % (ref 11.5–15.5)
WBC: 18.9 10*3/uL — ABNORMAL HIGH (ref 4.0–10.5)
nRBC: 0 % (ref 0.0–0.2)

## 2024-01-07 LAB — BASIC METABOLIC PANEL WITH GFR
Anion gap: 6 (ref 5–15)
BUN: 14 mg/dL (ref 6–20)
CO2: 25 mmol/L (ref 22–32)
Calcium: 8.3 mg/dL — ABNORMAL LOW (ref 8.9–10.3)
Chloride: 107 mmol/L (ref 98–111)
Creatinine, Ser: 0.79 mg/dL (ref 0.44–1.00)
GFR, Estimated: >60 mL/min (ref >60)
Glucose, Bld: 79 mg/dL (ref 70–99)
Potassium: 3.8 mmol/L (ref 3.5–5.1)
Sodium: 138 mmol/L (ref 135–145)

## 2024-01-07 LAB — CULTURE, BETA STREP (GROUP B ONLY)

## 2024-01-07 NOTE — Lactation Note (Addendum)
 This note was copied from a baby's chart. Lactation Consultation Note  Patient Name: Theresa Parker [id number] Date: 01/07/2024 Age:34 hours Reason for consult: Follow-up assessment;Late-preterm 34-36.6wks;Infant < 5lbs;NICU baby   Maternal Data This is mom's 3rd baby(last baby is now 39 years old), delivered by C/S for FHR indication. Mom with history of of anxiety, depression, GDM, chronic hepatitis C(mom understands if she were to get a cracked bleeding nipple to avoid breastfeeding or providing expressed milk from affected breast until it is healed), MJ use in pregnancy(mom and baby UDS positive),tobacco smoker(1.5 packs per day per 14 years). Per mom she has been trying to cut back on smoking. Mom with history of IV drug use( not during this pregnancy). On follow-up today mom's milk is in . Mom prefers to use her mom cozies to pump with.Per mom she is getting more milk with her wearables. Mom requested her breasts to be assessed. Assessment of mom's breast indicate she is experiencing some breast engorgement.  Has patient been taught Hand Expression?: Yes Does the patient have breastfeeding experience prior to this delivery?: Yes  Feeding Mother's Current Feeding Choice: Breast Milk and Formula    Lactation Tools Discussed/Used Tools: Pump Breast pump type: Other (comment) (Mom has chosen to ose her mom cozies hands free pump. Per mom she is getting large amounts with her mom cozies and she wasn't getting much with the [employer] Symphony pump.) Reason for Pumping: LPT in SCN Pumping frequency: goal is 8 times in 24 hours Pumped volume:  (mom is now pumping 50-60 ml's per pump session.)  Interventions  Education, Ice. Discussed with mom potential for inconsistent breastfeeding related to preterm birth until baby's original due date. Per mom her goal is to breastfeed. Discussed minimizing exposure of nicotine in breastmilk by pumping prior to smoking and encouraged mom to try to decrease  her smoking. Per mom she has been cutting down her smoking.  Discharge Discharge Education: Engorgement and breast care Pump: Personal;Hands Free;Manual  Consult Status Consult Status: Follow-up Date: 01/08/24 Follow-up type: In-patient  Update provided to care nurse.   Angelica Kemp 01/07/2024, 7:07 PM

## 2024-01-07 NOTE — Clinical Social Work Maternal (Signed)
 CLINICAL SOCIAL WORK MATERNAL/CHILD NOTE   Patient Details  Name: Theresa Parker MRN: [id number] Date of Birth: 11/09/1990   Date:  05-16-24   Clinical Social Worker Initiating Note:  Shaily Librizzi, RN, BSN Date/Time: Initiated:  2024/04/06/          Child's Name:  Theresa Parker    Biological Parents:  Mother    Need for Interpreter:  None    Reason for Referral:  Current Substance Use/Substance Use During Pregnancy      Address:  [address] [place] [address]    Phone number:  [phone] (home)      Additional phone number:    Household Members/Support Persons (HM/SP):    (FOB Lanna Pitt, and 34 year old son lives in the household.)     HM/SP Name Relationship DOB or Age  HM/SP -1     HM/SP -2     HM/SP -3     HM/SP -4     HM/SP -5     HM/SP -6     HM/SP -7     HM/SP -8         Natural Supports (not living in the home):  Extended Family    Professional Supports:      Employment:      Type of Work: Unemployed    Education:  Some Economist arranged:     Surveyor, quantity Resources:  [employer]    Other Resources:  Sales executive   (Discussed WIC resource)    Cultural/Religious Considerations Which May Impact Care:  None   Strengths:  Ability to meet basic needs  , Psychotropic Medications, Pediatrician chosen, Home prepared for child      Psychotropic Medications:  Lexapro       Pediatrician:    [facility]   Pediatrician List:    [facility]   [place]   [facility]  [facility]       Pediatrician Fax Number:     Risk Factors/Current Problems:  Substance Use      Cognitive State:  Alert      Mood/Affect:  Other  (Comment) (Appropriate)    CSW Assessment:  Chart reviewed.  I have spoken with Theresa Parker.  I have informed her of my role as RN, Case Manager and I have explained to her that I have received consult for substance abuse during pregnancy and  positive toxicology.  I have informed Theresa Parker that her toxicology report was positive for Cannabinoid, and Tricyclic.  I have also made her aware that the baby's toxicology reports where positive for Cannabinoid. I have informed her that per hospital policy a CPS report has to be made.  She verbalized understanding.     Mrs. Stoney informs me that she was taken off her anxiety and sleeping medications during pregnancy.  She reports that she used Cannabinoid during pregnancy because of her increase anxiety and to help her cope with stress.  She also reports that she could not eat during pregnancy.  She also used Cannabinoid to help with eating.    Theresa Parker confirms that her address is [address]  [address] [place] [address].  Her contact number is [phone].     Theresa Parker informs me that FOB of is Lanna Pitt and his contact number is [phone].    Theresa Parker reports that FOB, and her 34 year old son live  in the household.  She reports that FOB and grandmother will be the supports for the baby on discharge.     Theresa Parker reports that she has a history of Mental Health.  She reports that she has a history of anxiety, PTSD, OCD, depression and Bipolar.  She informs me that she is currently on Lexapro.  She receives Therapy and Counseling from [facility].     We have discussed Postpartum Depression and Sudden Infant Death syndrome.  She denies any suicidal or homicidal thoughts.     She has chosen [employer] for [employer].  She has a bassinet, pack and play, clothes, diapers, and a car seat for the baby.  She has transportation for appointments for she and the baby.     She has voiced no other concerns.     Message leave for CPS to call substance abuse report.     TOC will continue to follow.       CSW Plan/Description:  Sudden Infant Death Syndrome (SIDS) Education, Other Patient/Family Education, Hospital Drug Screen Policy Information, Child Protective  Service Report        Ymani Porcher A Roise Emert, RN Aug 07, 2024, 4:21 PM

## 2024-01-07 NOTE — Progress Notes (Signed)
 Post Partum Day 2  Subjective: Doing well, no concerns. Ambulating without difficulty, pain managed with PO meds, tolerating regular diet, and voiding without difficulty.   No fever/chills, chest pain, shortness of breath, nausea/vomiting, or leg pain. No nipple or breast pain. No headache, visual changes, or RUQ/epigastric pain.  Objective: BP 104/70 (BP Location: Left Arm)   Pulse (!) 57   Temp 97.9 F (36.6 C) (Oral)   Resp 20   Ht 5\' 1"  (1.549 m)   Wt 67.1 kg   LMP 05/11/2023 (Exact Date)   SpO2 100%   Breastfeeding Unknown   BMI 27.96 kg/m    Physical Exam:  General: alert and cooperative Breasts: soft/nontender CV: RRR Pulm: nl effort Abdomen: soft, non-tender Uterine Fundus: firm Incision: healing well, no significant drainage Perineum: minimal edema, intact Lochia: appropriate DVT Evaluation: No evidence of DVT seen on physical exam. Edinburgh:     01/05/2024    9:14 PM  Theresa Parker Postnatal Depression Scale Screening Tool  I have been able to laugh and see the funny side of things. 0  I have looked forward with enjoyment to things. 0  I have blamed myself unnecessarily when things went wrong. 1  I have been anxious or worried for no good reason. 0  I have felt scared or panicky for no good reason. 0  Things have been getting on top of me. 0  I have been so unhappy that I have had difficulty sleeping. 0  I have felt sad or miserable. 0  I have been so unhappy that I have been crying. 1  The thought of harming myself has occurred to me. 0  Edinburgh Postnatal Depression Scale Total 2     Recent Labs    01/06/24 0718 01/07/24 0519  HGB 7.3* 8.4*  HCT 21.0* 24.7*  WBC 16.3* 18.9*  PLT 136* 191    Assessment/Plan: 33 y.o. [id number] postpartum day # 2  1. Continue routine postpartum care  2. Infant feeding status: expressed breast milk -Lactation consult PRN for breastfeeding   3. Contraception plan: undecided   4. Acute blood loss anemia -  clinically significant.  -Asymptomatic -Intervention: continue on oral supplementation with ferrous sulfate  325 and IV iron  transfusion with venofer  given   5. Immunization status:   all immunizations up to date  6. Nicotine dependence  -We discussed that I do not recommend she leave the hospital to smoke  -Nicotine patches/gum has been offered - she declined nicotine replacement products.    7. Chronic Hep C -HCV quant ordered - pending -Previous quant 700,000 -Will refer to GI postpartum for treatment    8. Marijuana use in pregnancy  -UDS positive for Marijuana on admission  -She has a history of substance use disorder but has not used substances like cocain and heroin in over 9 years. States marijuana and nicotine is the only substance she currently uses  -TOC consult placed - pending  Disposition: Continue inpatient postpartum care    LOS: 3 days   Theresa Parker, CNM 01/07/2024, 8:49 AM

## 2024-01-07 NOTE — Plan of Care (Signed)
  Problem: Education: Goal: Ability to describe self-care measures that may prevent or decrease complications (Diabetes Survival Skills Education) will improve Outcome: Progressing Goal: Individualized Educational Video(s) Outcome: Progressing   Problem: Coping: Goal: Ability to adjust to condition or change in health will improve Outcome: Progressing   Problem: Fluid Volume: Goal: Ability to maintain a balanced intake and output will improve Outcome: Progressing   Problem: Health Behavior/Discharge Planning: Goal: Ability to identify and utilize available resources and services will improve Outcome: Progressing Goal: Ability to manage health-related needs will improve Outcome: Progressing   Problem: Metabolic: Goal: Ability to maintain appropriate glucose levels will improve Outcome: Progressing   Problem: Nutritional: Goal: Maintenance of adequate nutrition will improve Outcome: Progressing Goal: Progress toward achieving an optimal weight will improve Outcome: Progressing   Problem: Skin Integrity: Goal: Risk for impaired skin integrity will decrease Outcome: Progressing   Problem: Tissue Perfusion: Goal: Adequacy of tissue perfusion will improve Outcome: Progressing   Problem: Education: Goal: Knowledge of condition will improve Outcome: Progressing   Problem: Activity: Goal: Will verbalize the importance of balancing activity with adequate rest periods Outcome: Progressing Goal: Ability to tolerate increased activity will improve Outcome: Progressing   Problem: Coping: Goal: Ability to identify and utilize available resources and services will improve Outcome: Progressing   Problem: Life Cycle: Goal: Chance of risk for complications during the postpartum period will decrease Outcome: Progressing   Problem: Role Relationship: Goal: Ability to demonstrate positive interaction with newborn will improve Outcome: Progressing   Problem: Skin  Integrity: Goal: Demonstration of wound healing without infection will improve Outcome: Progressing   Problem: Education: Goal: Knowledge of General Education information will improve Description: Including pain rating scale, medication(s)/side effects and non-pharmacologic comfort measures Outcome: Progressing   Problem: Health Behavior/Discharge Planning: Goal: Ability to manage health-related needs will improve Outcome: Progressing   Problem: Clinical Measurements: Goal: Ability to maintain clinical measurements within normal limits will improve Outcome: Progressing Goal: Will remain free from infection Outcome: Progressing Goal: Diagnostic test results will improve Outcome: Progressing Goal: Respiratory complications will improve Outcome: Progressing Goal: Cardiovascular complication will be avoided Outcome: Progressing   Problem: Activity: Goal: Risk for activity intolerance will decrease Outcome: Progressing   Problem: Nutrition: Goal: Adequate nutrition will be maintained Outcome: Progressing   Problem: Coping: Goal: Level of anxiety will decrease Outcome: Progressing   Problem: Elimination: Goal: Will not experience complications related to bowel motility Outcome: Progressing Goal: Will not experience complications related to urinary retention Outcome: Progressing   Problem: Pain Managment: Goal: General experience of comfort will improve and/or be controlled Outcome: Progressing   Problem: Safety: Goal: Ability to remain free from injury will improve Outcome: Progressing   Problem: Skin Integrity: Goal: Risk for impaired skin integrity will decrease Outcome: Progressing

## 2024-01-08 MED ORDER — SIMETHICONE 80 MG PO CHEW: 80.0000 mg | CHEWABLE_TABLET | Freq: Four times a day (QID) | ORAL | 0 refills | Status: AC | PRN

## 2024-01-08 MED ORDER — FERROUS SULFATE 325 (65 FE) MG PO TABS: 325.0000 mg | ORAL_TABLET | Freq: Two times a day (BID) | ORAL | 3 refills | Status: AC

## 2024-01-08 MED ORDER — ACETAMINOPHEN 500 MG PO TABS: 1000.0000 mg | ORAL_TABLET | Freq: Four times a day (QID) | ORAL | 0 refills | Status: AC | PRN

## 2024-01-08 MED ORDER — ESCITALOPRAM OXALATE 10 MG PO TABS: 20.0000 mg | ORAL_TABLET | Freq: Every day | ORAL | 11 refills | Status: AC

## 2024-01-08 MED ORDER — SENNOSIDES-DOCUSATE SODIUM 8.6-50 MG PO TABS: 2.0000 | ORAL_TABLET | Freq: Every evening | ORAL | 0 refills | Status: AC | PRN

## 2024-01-08 MED ORDER — IBUPROFEN 600 MG PO TABS
600.0000 mg | ORAL_TABLET | Freq: Four times a day (QID) | ORAL | 0 refills | Status: AC | PRN
Start: 2024-01-08 — End: ?

## 2024-01-08 MED ORDER — OXYCODONE HCL 5 MG PO TABS: 5.0000 mg | ORAL_TABLET | Freq: Four times a day (QID) | ORAL | 0 refills | Status: AC | PRN

## 2024-01-08 NOTE — Lactation Note (Signed)
 This note was copied from a baby's chart. Lactation Consultation Note  Patient Name: Theresa Parker [id number] Date: 01/08/2024 Age:34 hours Reason for consult: Follow-up assessment;NICU baby;Late-preterm 34-36.6wks;Other (Comment)   Maternal Data Follow up assessment and discharge education w/ mom in SCN.  Mom stated that pumping is going well.  She is starting to feel a little bit engorged.  She expressed that she put ice on her breast at night and it really helped with what she was experiencing.  Feeding Mother's Current Feeding Choice: Breast Milk  Interventions Interventions: Education;CDC milk storage guidelines  Discharge LC provided patient with bigger bottles for pumping.  Reviewed milk storage guidelines, engorgement and mastitis prevention/treatment.   Consult Status Consult Status: Follow-up Follow-up type: Call as needed    Arlyne Lame 01/08/2024, 10:31 AM

## 2024-01-08 NOTE — Progress Notes (Signed)
Patient went downstairs

## 2024-01-08 NOTE — Discharge Instructions (Signed)
Discharge instructions Bleeding: Your bleeding could continue up to 6 weeks, the flow should gradually decrease and the color should become dark then lightened over the next couple of weeks. If you notice you are bleeding heavily or passing clots larger than the size of your fist, PLEASE call your physician. No TAMPONS, DOUCHING, ENEMAS OR SEXUAL INTERCOURSE for 6 weeks. Incision: The honeycomb dressing can be removed in 5-7 days. Remove earlier if any water gets underneath it or if there is a lot of drainage. After the dressing is off, you can let the warm soapy water from the shower run over the incision and pat dry with a clean towel. Watch the incision for signs of infection, such as, redness, warmth, oozing pus. AfterPains: This is the uterus contracting back to its normal position and size. Use medications prescribed or recommended by your physician to help relieve this discomfort. Bowels/Hemorrhoids: Drink plenty of water and stay active. Increase fiber, fresh fruits and vegetables in your diet. Rest/Activity: Rest when the baby is resting;  Do not lift > 10 lbs for 6 weeks. No driving for 1-2 weeks. Bathing: Shower daily! Diet: Continue daily prenatal vitamin and iron until your follow up visit to help replenish nutrients and vitamins. If breastfeeding eat extra calories and increase your fluid intake to 12 glasses a day. Contraception: Consult with your provider on what method of birth control you would like to use. Breastfeeding: You may have a slight fever when your milk comes in, but it should go away on its own. If it does not, and rises above 101.0 please call the doctor. Bottlefeeding: wear a snug fitting bra without underwires continuously for 3-5days, avoid any nipple/breast stimulation. If engorgement occurs, take ibuprofen as prescribed and apply fresh green cabbage leaves directly to your breasts inside the bra cups. Postpartum "BLUES": It is common to emotional days after delivery,  however if it persist for greater than 2 weeks or if you feel concerned please let your physician know immediately. This is hormone driven and nothing you can control so please let someone know how you feel. Follow Up Visit: Please schedule a follow up visit with your delivering provider  Call office if you have any of the following: headache, visual changes, fever >101.0 F, chills, breast concerns, excessive vaginal bleeding, incision drainage or problems, leg pain or redness, depression or any other concerns.  For concerns about your baby, please call your pediatrician For breastfeeding concerns, the lactation consultant can be reached at [phone]

## 2024-01-08 NOTE — Lactation Note (Signed)
 This note was copied from a baby's chart. Lactation Consultation Note  Patient Name: Theresa Parker [id number] Date: 01/08/2024 Age:34 days     Maternal Data   MOB/SCN RN requests LC assist with nipple shield fitting for transitioning infant to feeding at breast. LC sized at 24mm shield, with visible milk output pooling into shield while MOB tested latch and infant oral capacity.  Feeding   LC entry following feed, next feeding scheduled for 6pm with SCN RN. Update provided with instructions given to MOB regarding shield cleaning and procedural weaning off of intervention with [facility] assistance either inpatient or at outpatient clinic upon discharge.    Lactation Tools Discussed/Used  24 mm shield, 21 mm shield left with MOB in the event active suckling necessitates any adjustments to current sizing of 24mm.   Interventions   Education provided about prevention of oversupply, signs of engorgement, cleaning and storage of breast milk collection devices, and positioning to facilitate optimal infant gape to compress nipple and shield for proression of feeds as appropriately ordered by SCN.   Discharge  Outpatient lactation available for any adjustments to feeding plan/pumping schedule on floor should any complications arise while baby is admitted, as well as any time following discharge.   Consult Status  PRN    Bronson Canny 01/08/2024, 3:51 PM

## 2024-01-08 NOTE — TOC Progression Note (Signed)
 Transition of Care [facility]) - Progression Note    Patient Details  Name: Theresa Parker MRN: [id number] Date of Birth: [birth year]  Transition of Care [facility]) CM/SW Contact  Violette Morneault A Christobal Morado, RN Phone Number: 01/08/2024, 12:24 PM  Clinical Narrative:    Chart reviewed.  CPS reported called to [facility] with [facility] CPS department.    TOC will continue to follow.          Expected Discharge Plan and Services                                               Social Determinants of Health (SDOH) Interventions SDOH Screenings   Food Insecurity: No Food Insecurity (01/05/2024)  Housing: Unknown (01/05/2024)  Transportation Needs: No Transportation Needs (01/05/2024)  Utilities: Not At Risk (01/05/2024)  Depression (PHQ2-9): Low Risk  (05/15/2022)  Financial Resource Strain: Low Risk  (09/23/2023)   [facility]  Social Connections: Unknown (01/05/2024)  Tobacco Use: High Risk (01/04/2024)    Readmission Risk Interventions     No data to display

## 2024-01-08 NOTE — Progress Notes (Signed)
 Discharge instructions reviewed with patient with day shift nurse. All belongings together and ready to go. Patient to spend time with infant in SCN for midnight feed and then will be going home with significant other. All questions and concerns answered.

## 2024-01-09 ENCOUNTER — Ambulatory Visit: Payer: Self-pay

## 2024-01-09 NOTE — Lactation Note (Signed)
 This note was copied from a baby's chart. Lactation Consultation Note  Patient Name: Theresa Parker [id number] Date: 01/09/2024 Age:34 days     Maternal Data Mom expressed that she is still using her [name] pump.  At the time of the visit, she expressed that her rt breast was full but she didn't have a pump.  She is not interested in the Medela pump.  She states that her body doesn't like that pump but she does like the manual pump.    Feeding LC checked in with mom of baby Theresa.  MOB nursing infant in football hold on the rt side.  She stated that feeding is going ok.  She is currently using a nipple shield.  Infants lips are flanged out.  LC made minor adjustment to lower lip so that it is flanged out more.  Lactation Tools Discussed/Used A Medela Manual pump was placed at bedside with family.   Consult Status      Binnie Buffalo Snigdha Howser 01/09/2024, 3:44 PM

## 2024-01-11 ENCOUNTER — Ambulatory Visit: Payer: Self-pay

## 2024-01-11 NOTE — Lactation Note (Signed)
 This note was copied from a baby's chart. Lactation Consultation Note  Patient Name: Theresa Parker ZOXWR'U Date: 01/11/2024 Age:34 days Reason for consult: Follow-up assessment;NICU baby;Late-preterm 34-36.6wks   Maternal Data Does the patient have breastfeeding experience prior to this delivery?: Yes  Feeding Mother's Current Feeding Choice: Breast Milk Mom holding infant when I checked on her, states that pumping is going well for her and has large amt of EBM in SCN refrigerator, she has no concerns at this time.   LATCH Score Latch:  (I did not observe a feeding at the breast)  Audible Swallowing: A few with stimulation  Type of Nipple: Everted at rest and after stimulation  Comfort (Breast/Nipple): Soft / non-tender  Hold (Positioning): Assistance needed to correctly position infant at breast and maintain latch.  LATCH Score: 7   Lactation Tools Discussed/Used Tools: Nipple Shields;52F feeding tube / Syringe Nipple shield size: 24  Interventions Interventions: Support pillows;Assisted with latch;Skin to skin;Pre-pump if needed  Discharge Pump: Personal;DEBP  Consult Status Consult Status: PRN Date: 01/12/24 Follow-up type: In-patient    [name] 01/11/2024, 4:01 PM

## 2024-01-15 DIAGNOSIS — O9089 Other complications of the puerperium, not elsewhere classified: Secondary | ICD-10-CM | POA: Diagnosis not present

## 2024-01-15 DIAGNOSIS — H538 Other visual disturbances: Secondary | ICD-10-CM | POA: Diagnosis not present

## 2024-01-15 DIAGNOSIS — R300 Dysuria: Secondary | ICD-10-CM | POA: Diagnosis not present

## 2024-01-15 DIAGNOSIS — R519 Headache, unspecified: Secondary | ICD-10-CM | POA: Diagnosis not present

## 2024-01-30 DIAGNOSIS — Z419 Encounter for procedure for purposes other than remedying health state, unspecified: Secondary | ICD-10-CM | POA: Diagnosis not present

## 2024-02-29 DIAGNOSIS — Z419 Encounter for procedure for purposes other than remedying health state, unspecified: Secondary | ICD-10-CM | POA: Diagnosis not present

## 2024-03-26 DIAGNOSIS — B182 Chronic viral hepatitis C: Secondary | ICD-10-CM | POA: Diagnosis not present

## 2024-03-31 DIAGNOSIS — Z419 Encounter for procedure for purposes other than remedying health state, unspecified: Secondary | ICD-10-CM | POA: Diagnosis not present

## 2024-04-13 DIAGNOSIS — F411 Generalized anxiety disorder: Secondary | ICD-10-CM | POA: Diagnosis not present

## 2024-04-13 DIAGNOSIS — F332 Major depressive disorder, recurrent severe without psychotic features: Secondary | ICD-10-CM | POA: Diagnosis not present

## 2024-04-13 DIAGNOSIS — Z1331 Encounter for screening for depression: Secondary | ICD-10-CM | POA: Diagnosis not present

## 2024-04-23 ENCOUNTER — Other Ambulatory Visit: Payer: Self-pay

## 2024-04-23 ENCOUNTER — Ambulatory Visit

## 2024-04-23 VITALS — BP 106/64 | HR 73 | Ht 61.0 in | Wt 119.0 lb

## 2024-04-23 DIAGNOSIS — Z30011 Encounter for initial prescription of contraceptive pills: Secondary | ICD-10-CM

## 2024-04-23 DIAGNOSIS — Z309 Encounter for contraceptive management, unspecified: Secondary | ICD-10-CM | POA: Diagnosis not present

## 2024-04-23 DIAGNOSIS — F422 Mixed obsessional thoughts and acts: Secondary | ICD-10-CM | POA: Diagnosis not present

## 2024-04-23 DIAGNOSIS — F332 Major depressive disorder, recurrent severe without psychotic features: Secondary | ICD-10-CM | POA: Diagnosis not present

## 2024-04-23 DIAGNOSIS — Z3009 Encounter for other general counseling and advice on contraception: Secondary | ICD-10-CM

## 2024-04-23 DIAGNOSIS — F4310 Post-traumatic stress disorder, unspecified: Secondary | ICD-10-CM | POA: Diagnosis not present

## 2024-04-23 DIAGNOSIS — R1904 Left lower quadrant abdominal swelling, mass and lump: Secondary | ICD-10-CM

## 2024-04-23 DIAGNOSIS — F1011 Alcohol abuse, in remission: Secondary | ICD-10-CM | POA: Diagnosis not present

## 2024-04-23 DIAGNOSIS — F411 Generalized anxiety disorder: Secondary | ICD-10-CM | POA: Diagnosis not present

## 2024-04-23 MED ORDER — NORETHINDRONE 0.35 MG PO TABS: 1.0000 | ORAL_TABLET | Freq: Every day | ORAL | 11 refills | Status: DC

## 2024-04-23 NOTE — Progress Notes (Signed)
 Pt is here for family planning visit.  Family planning education card reviewed and given to pt. Larraine JONELLE Northern, RN

## 2024-04-23 NOTE — Progress Notes (Signed)
 [facility]  Subjective:  Theresa Parker is a 33 y.o. being seen today for contraception.  Chief Complaint  Patient presents with   Annual Exam   HPI: Patient to clinic today for oral contraception. Patient is current tobacco smoker. Had baby in May, 2025. Reports she had a pap done with that pregnancy at [facility], showed clinician MyChart result of NILM/negative HPV. 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   No desire for STI testing today. Not currently sexually active. Only desire today is for oral contraceptives.  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   Gestational diabetes mellitus (GDM), antepartum 12/17/2023   History of preterm premature rupture of membranes (PPROM) 12/17/2023   Chest pain varying with breathing 12/08/2023   Supervision of high risk pregnancy in third trimester 07/30/2023   PTSD (post-traumatic stress disorder) 05/01/2022   Abnormal Pap smear of cervix 02/03/20 02/09/2020   Smoker 1-2 ppd 02/03/2020   History of crack cocaine use ages [age] with rehab x5 02/03/2020   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 Yes   No components found for: HCV]  Health Maintenance Due  Topic Date Due   COVID-19 Vaccine (1) Never done   DTaP/Tdap/Td (1 - Tdap) Never done   Hepatitis B Vaccines 19-59 Average Risk (1 of 3 - 19+ 3-dose series) Never done   HPV VACCINES (1 - 3-dose SCDM series) Never done   Cervical Cancer Screening (Pap smear)  07/20/2023   INFLUENZA VACCINE  03/20/2024   Review of Systems  Constitutional:  Positive for weight loss.  All other systems reviewed and are negative. Patient reports difficulty gaining weight, poor appetite.  The following portions of the patient's history were reviewed and updated as appropriate: allergies, current medications, past family history, past medical history, past social history, past surgical history and problem list. Problem list updated.  See flowsheet for other program required questions.  Objective:   Vitals:   04/23/24 1027  BP: 106/64  Pulse: 73  Weight: 119 lb (54 kg)  Height: 5' 1 (1.549 m)   Physical Exam Constitutional:      Appearance: Normal appearance.  HENT:     Head: Normocephalic.     Mouth/Throat:     Lips: Pink. Lesions present.     Mouth: Mucous membranes are moist. Oral lesions present.  Eyes:     General: No scleral icterus.       Right eye: No discharge.        Left eye: No discharge.  Cardiovascular:     Heart sounds: Normal heart sounds, S1 normal and S2  normal.  Pulmonary:     Effort: Pulmonary effort is normal.     Breath sounds: Normal breath sounds.  Abdominal:     General: Abdomen is flat. Bowel sounds are normal. There is no distension.     Palpations: Abdomen is soft. There is mass.     Tenderness: There is abdominal tenderness in the left lower quadrant. There is no guarding or rebound.     Comments: Mobile, oval mass felt in LLQ. Patient with tenderness in that spot and states she was told she had 2 ovarian cysts on that  ovary.  Well healed c-section scar  Lymphadenopathy:     Cervical: No cervical adenopathy.  Skin:    General: Skin is warm and dry.     Comments: Scattered scars on face  Neurological:     Mental Status: She is alert.  Psychiatric:        Mood and Affect: Mood normal.        Behavior: Behavior normal.    Assessment and Plan:  Theresa Parker is a 33 y.o. female presenting to the [facility] Department for a Women's Health problem visit  1. Family planning (Primary)  - Estrogen contraindicated due to current heavy smoker - Reports she does not want to quit smoking at this time - Plan for progesterone only pills - No breast or vaginal concerns today. Not breastfeeding anymore. - Offered nutrition referral for difficulty gaining weight, patient declined.  2. Encounter for initial prescription of contraceptive pills  - norethindrone  (MICRONOR ) 0.35 MG tablet; Take 1 tablet (0.35 mg total) by mouth daily.  Dispense: 28 tablet; Refill: 11   3. Abdominal mass, LLQ (left lower quadrant)  - Soft, mobile mass palpated in LLQ, oval in shape. Tender to palpation, but not experiencing any pain in general and did not complain of pain during ROS. She is very thin, may be her intestine that is palpable. She also reports she was told she had 2 ovarian cysts on that ovary in the past. She states she has a regular ob/gyn at [facility]. Advised she make an appointment to follow up with that provider as we do not typically order pelvic imaging and will typically refer for these types of findings. No ovarian cyst or remarkable findings seen on OB US  on 11/20/23.   Return in about 1 year (around 04/23/2025).  No future appointments.  Damien FORBES Satchel, NP

## 2024-04-27 ENCOUNTER — Telehealth: Payer: Self-pay | Admitting: Family Medicine

## 2024-04-28 ENCOUNTER — Other Ambulatory Visit: Payer: Self-pay | Admitting: Physician Assistant

## 2024-04-28 DIAGNOSIS — Z30011 Encounter for initial prescription of contraceptive pills: Secondary | ICD-10-CM

## 2024-04-28 MED ORDER — NORETHINDRONE 0.35 MG PO TABS: 1.0000 | ORAL_TABLET | Freq: Every day | ORAL | 11 refills | Status: AC

## 2024-04-28 MED ORDER — NORETHINDRONE 0.35 MG PO TABS: 1.0000 | ORAL_TABLET | Freq: Every day | ORAL | 11 refills | Status: DC

## 2024-04-28 NOTE — Progress Notes (Signed)
 eRx provided for [facility] as original prescriber not yet Medicaid approved. Alois Colgan PA-C

## 2024-05-01 DIAGNOSIS — Z419 Encounter for procedure for purposes other than remedying health state, unspecified: Secondary | ICD-10-CM | POA: Diagnosis not present

## 2024-05-07 DIAGNOSIS — F332 Major depressive disorder, recurrent severe without psychotic features: Secondary | ICD-10-CM | POA: Diagnosis not present

## 2024-05-07 DIAGNOSIS — F422 Mixed obsessional thoughts and acts: Secondary | ICD-10-CM | POA: Diagnosis not present

## 2024-05-07 DIAGNOSIS — F4310 Post-traumatic stress disorder, unspecified: Secondary | ICD-10-CM | POA: Diagnosis not present

## 2024-05-07 DIAGNOSIS — F411 Generalized anxiety disorder: Secondary | ICD-10-CM | POA: Diagnosis not present

## 2024-05-19 IMAGING — DX DG WRIST COMPLETE 3+V*R*
4 series · 4 of 4 positions shown · non-contrast
Comparison: None Available.

CLINICAL DATA: Chronic pain wrist pain for 15 years

EXAM:
RIGHT WRIST - COMPLETE 3+ VIEW

[wrist ap (1 of 2)]
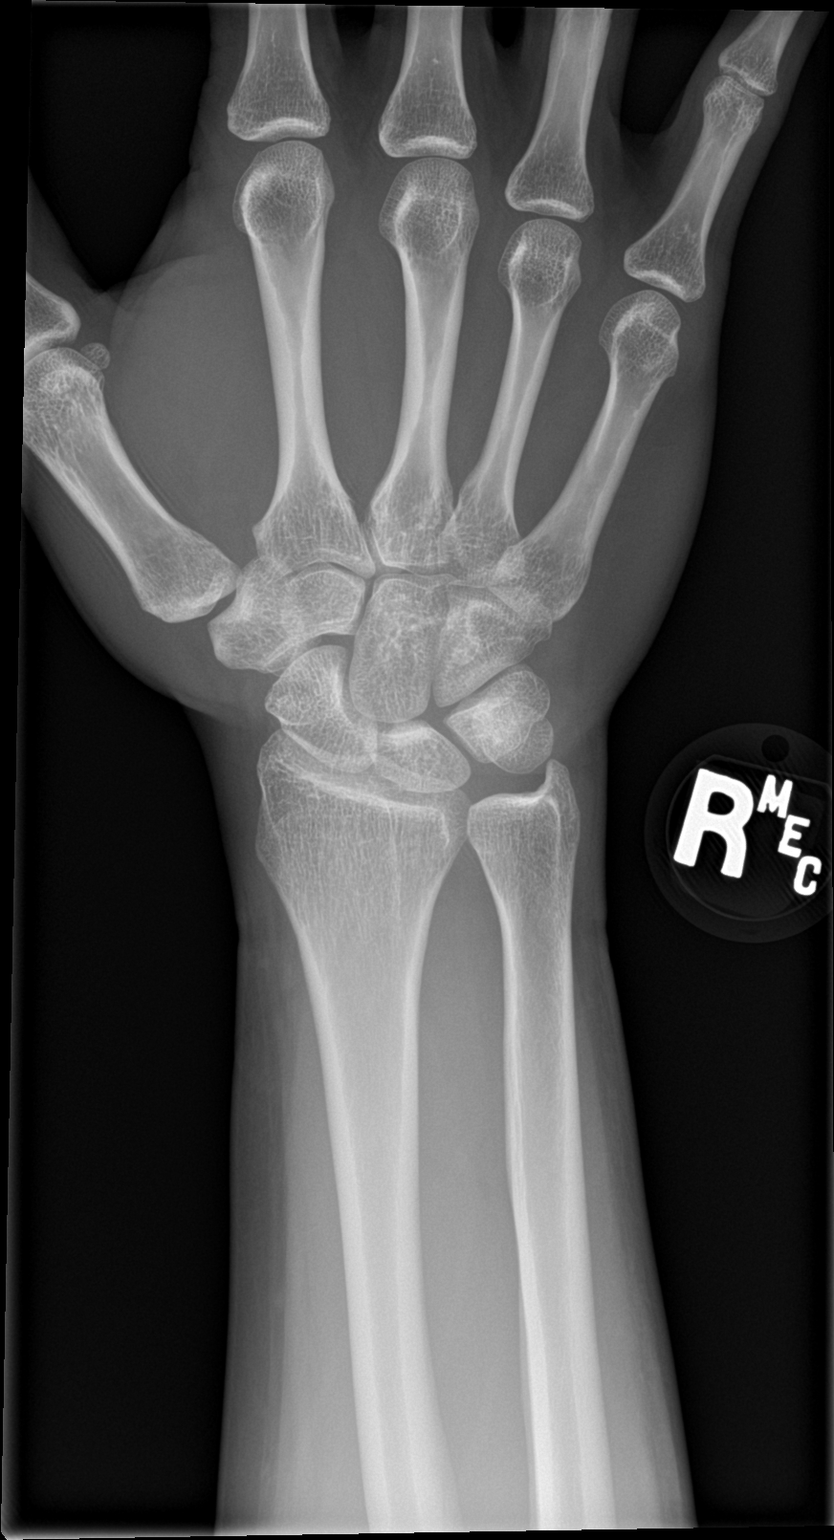

[wrist obl]
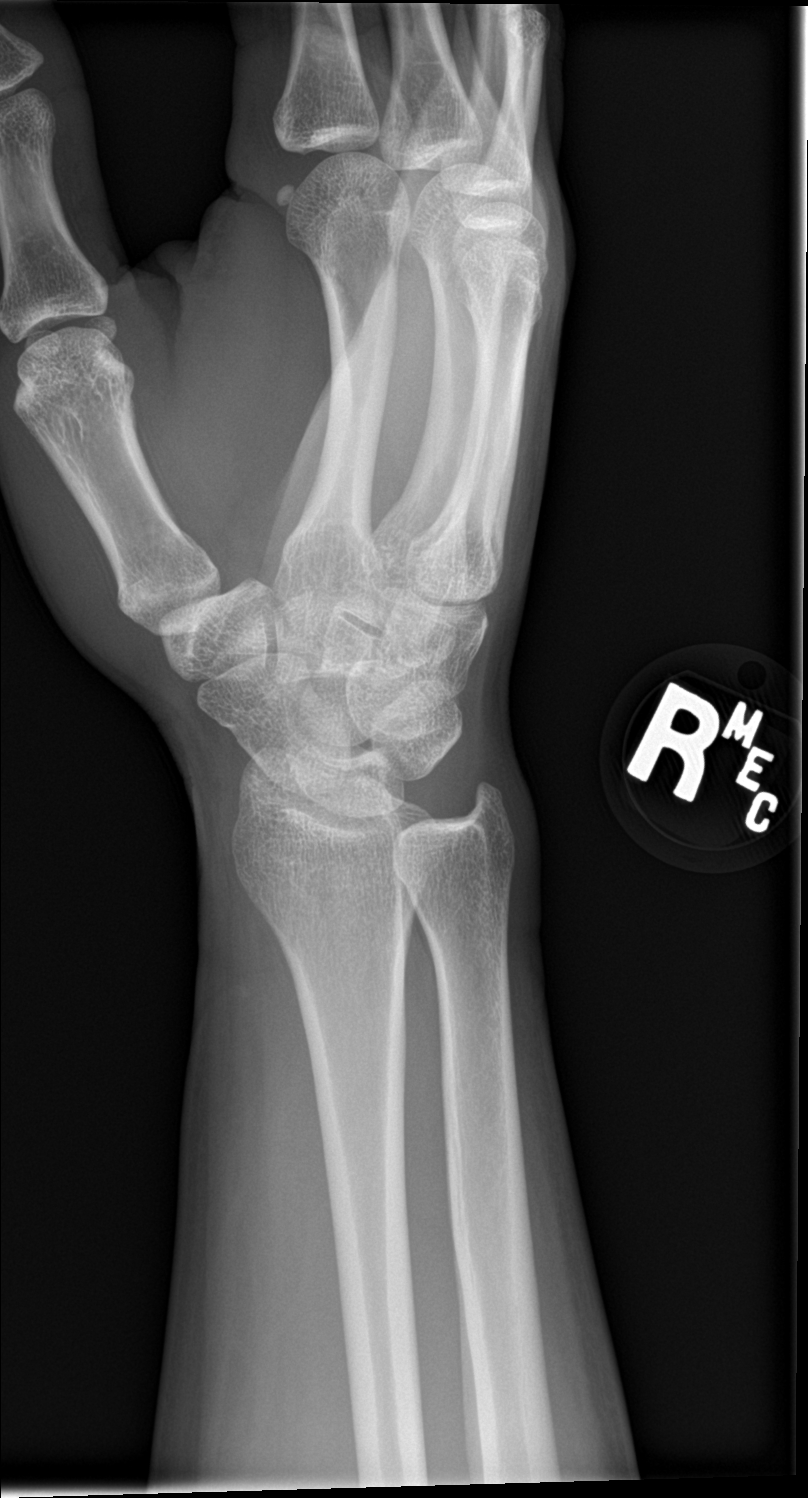

[wrist lat]
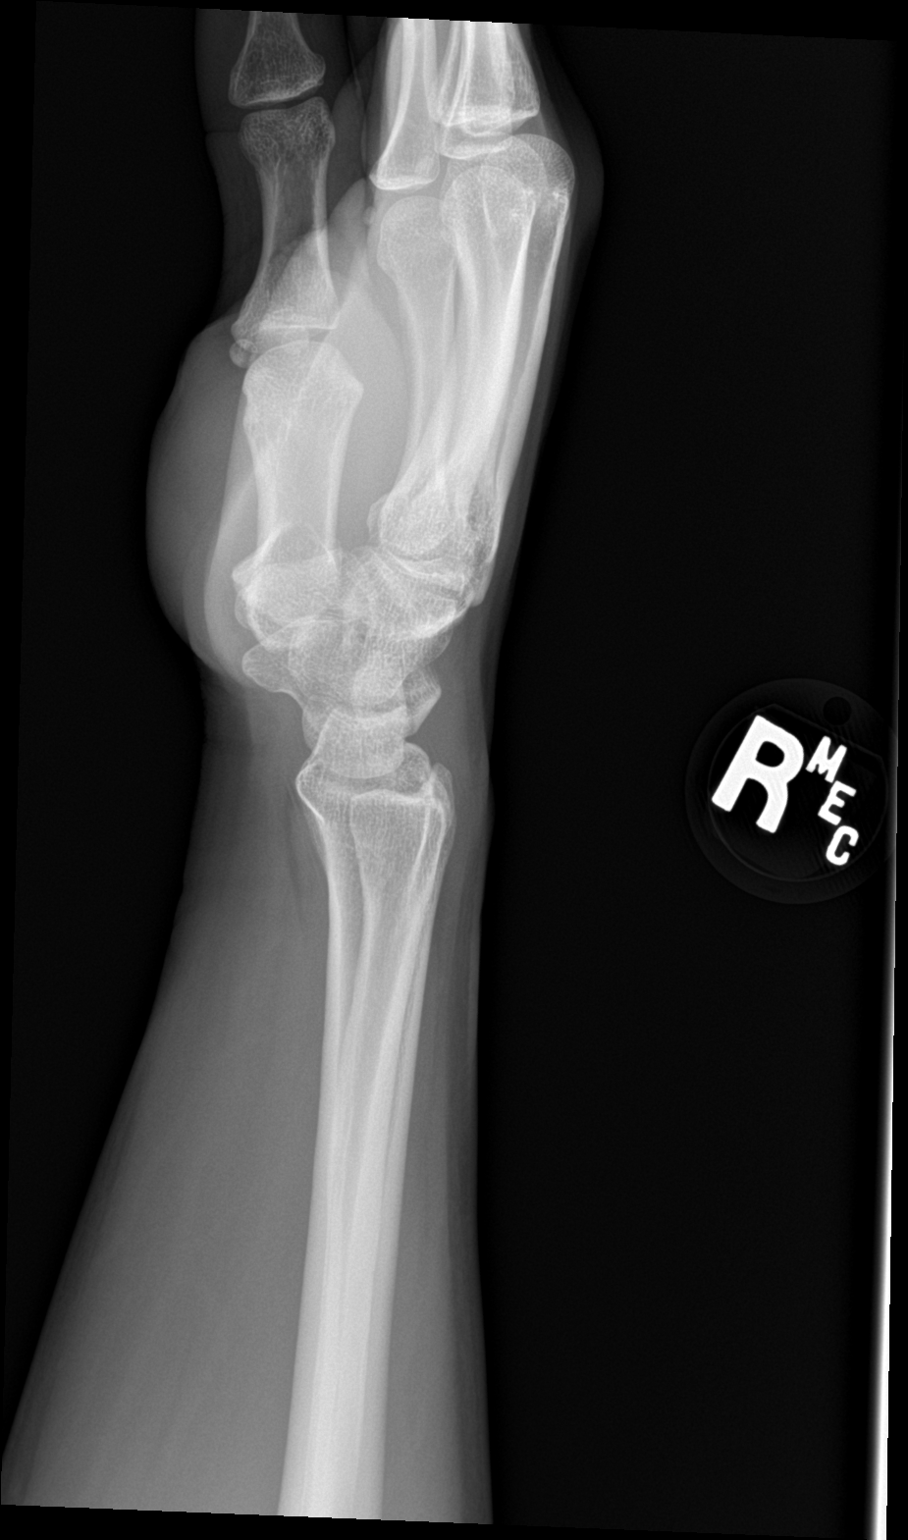

[wrist ap (2 of 2)]
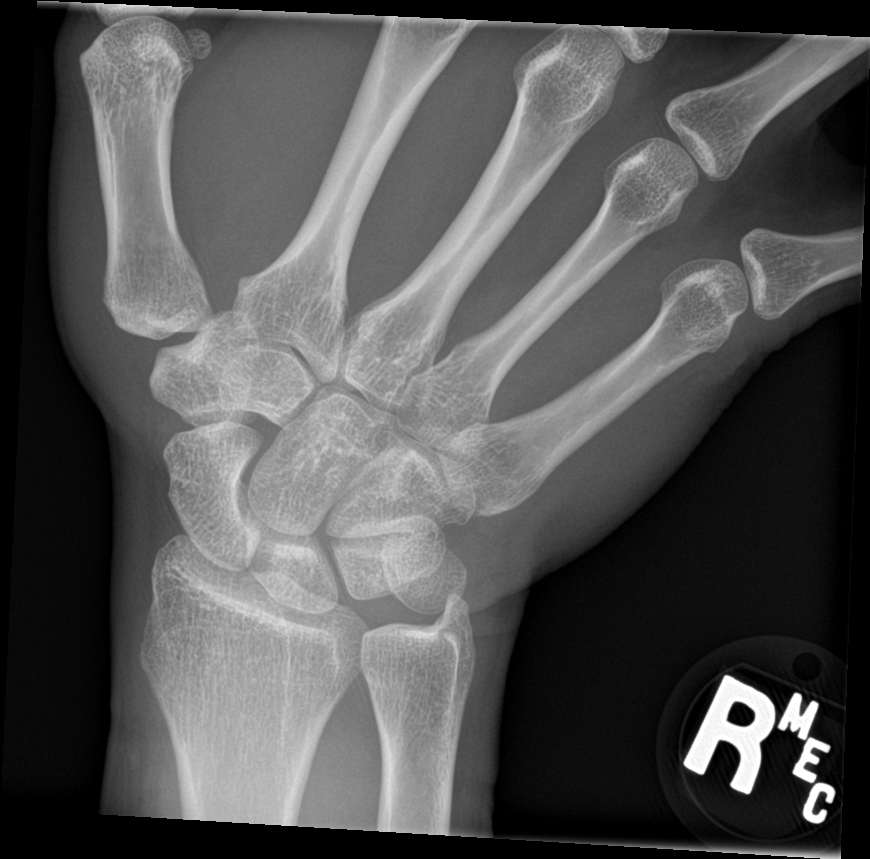

[4 of 4 positions shown; findings below may reference images not displayed]

FINDINGS: No distal radius or ulnar fracture. Radiocarpal joint is intact. No
carpal fracture. No soft tissue abnormality. No significant
arthropathy identified
IMPRESSION: No acute osseous abnormality.

## 2024-05-31 DIAGNOSIS — Z419 Encounter for procedure for purposes other than remedying health state, unspecified: Secondary | ICD-10-CM | POA: Diagnosis not present
